# Patient Record
Sex: Female | Born: 1980 | Race: White | Hispanic: No | Marital: Married | Smoking: Former smoker
Health system: Southern US, Community
[De-identification: ages and names within clinical notes are randomized; demographics above are authoritative.]

## PROBLEM LIST (undated history)

## (undated) DIAGNOSIS — E8940 Asymptomatic postprocedural ovarian failure: Secondary | ICD-10-CM

## (undated) DIAGNOSIS — E669 Obesity, unspecified: Secondary | ICD-10-CM

## (undated) DIAGNOSIS — IMO0001 Reserved for inherently not codable concepts without codable children: Secondary | ICD-10-CM

## (undated) HISTORY — PX: NO PAST SURGERIES: SHX2092

## (undated) HISTORY — DX: Asymptomatic postprocedural ovarian failure: E89.40

---

## 2005-07-07 ENCOUNTER — Other Ambulatory Visit: Admission: RE | Admit: 2005-07-07 | Discharge: 2005-07-07 | Payer: Self-pay | Admitting: Gynecology

## 2013-09-17 ENCOUNTER — Emergency Department (HOSPITAL_COMMUNITY): Payer: Managed Care, Other (non HMO)

## 2013-09-17 ENCOUNTER — Emergency Department (HOSPITAL_COMMUNITY)
Admission: EM | Admit: 2013-09-17 | Discharge: 2013-09-18 | Disposition: A | Discharge status: Home / Self Care | Payer: Managed Care, Other (non HMO) | Attending: Emergency Medicine | Admitting: Emergency Medicine

## 2013-09-17 ENCOUNTER — Encounter (HOSPITAL_COMMUNITY): Payer: Self-pay | Admitting: Emergency Medicine

## 2013-09-17 DIAGNOSIS — R110 Nausea: Secondary | ICD-10-CM | POA: Insufficient documentation

## 2013-09-17 DIAGNOSIS — E663 Overweight: Secondary | ICD-10-CM | POA: Insufficient documentation

## 2013-09-17 DIAGNOSIS — Z792 Long term (current) use of antibiotics: Secondary | ICD-10-CM | POA: Insufficient documentation

## 2013-09-17 DIAGNOSIS — Z3202 Encounter for pregnancy test, result negative: Secondary | ICD-10-CM | POA: Insufficient documentation

## 2013-09-17 DIAGNOSIS — N739 Female pelvic inflammatory disease, unspecified: Secondary | ICD-10-CM

## 2013-09-17 HISTORY — DX: Obesity, unspecified: E66.9

## 2013-09-17 LAB — CBC WITH DIFFERENTIAL/PLATELET
BASOS ABS: 0 10*3/uL (ref 0.0–0.1)
Basophils Relative: 0 % (ref 0–1)
EOS PCT: 0 % (ref 0–5)
Eosinophils Absolute: 0 10*3/uL (ref 0.0–0.7)
HCT: 35.7 % — ABNORMAL LOW (ref 36.0–46.0)
Hemoglobin: 11.2 g/dL — ABNORMAL LOW (ref 12.0–15.0)
Lymphocytes Relative: 14 % (ref 12–46)
Lymphs Abs: 1.6 10*3/uL (ref 0.7–4.0)
MCH: 24.4 pg — AB (ref 26.0–34.0)
MCHC: 31.4 g/dL (ref 30.0–36.0)
MCV: 77.8 fL — AB (ref 78.0–100.0)
MONO ABS: 0.5 10*3/uL (ref 0.1–1.0)
Monocytes Relative: 4 % (ref 3–12)
Neutro Abs: 9.6 10*3/uL — ABNORMAL HIGH (ref 1.7–7.7)
Neutrophils Relative %: 82 % — ABNORMAL HIGH (ref 43–77)
PLATELETS: 244 10*3/uL (ref 150–400)
RBC: 4.59 MIL/uL (ref 3.87–5.11)
RDW: 14.8 % (ref 11.5–15.5)
WBC: 11.8 10*3/uL — ABNORMAL HIGH (ref 4.0–10.5)

## 2013-09-17 LAB — WET PREP, GENITAL
Trich, Wet Prep: NONE SEEN
Yeast Wet Prep HPF POC: NONE SEEN

## 2013-09-17 LAB — URINE MICROSCOPIC-ADD ON

## 2013-09-17 LAB — COMPREHENSIVE METABOLIC PANEL
ALT: 28 U/L (ref 0–35)
AST: 24 U/L (ref 0–37)
Albumin: 3.7 g/dL (ref 3.5–5.2)
Alkaline Phosphatase: 92 U/L (ref 39–117)
BUN: 9 mg/dL (ref 6–23)
CALCIUM: 9.1 mg/dL (ref 8.4–10.5)
CO2: 22 mEq/L (ref 19–32)
CREATININE: 0.64 mg/dL (ref 0.50–1.10)
Chloride: 102 mEq/L (ref 96–112)
GLUCOSE: 97 mg/dL (ref 70–99)
Potassium: 4.1 mEq/L (ref 3.7–5.3)
Sodium: 140 mEq/L (ref 137–147)
Total Bilirubin: 0.5 mg/dL (ref 0.3–1.2)
Total Protein: 7.5 g/dL (ref 6.0–8.3)

## 2013-09-17 LAB — URINALYSIS, ROUTINE W REFLEX MICROSCOPIC
Bilirubin Urine: NEGATIVE
Glucose, UA: NEGATIVE mg/dL
KETONES UR: NEGATIVE mg/dL
NITRITE: NEGATIVE
PROTEIN: NEGATIVE mg/dL
Specific Gravity, Urine: 1.017 (ref 1.005–1.030)
UROBILINOGEN UA: 0.2 mg/dL (ref 0.0–1.0)
pH: 5.5 (ref 5.0–8.0)

## 2013-09-17 LAB — POC URINE PREG, ED: PREG TEST UR: NEGATIVE

## 2013-09-17 LAB — LIPASE, BLOOD: Lipase: 22 U/L (ref 11–59)

## 2013-09-17 MED ORDER — IOHEXOL 300 MG/ML  SOLN
100.0000 mL | Freq: Once | INTRAMUSCULAR | Status: AC | PRN
  Administered 2013-09-17: 100 mL via INTRAVENOUS

## 2013-09-17 MED ORDER — STERILE WATER FOR INJECTION IJ SOLN
INTRAMUSCULAR | Status: AC
  Filled 2013-09-17: qty 10

## 2013-09-17 MED ORDER — AZITHROMYCIN 250 MG PO TABS
1000.0000 mg | ORAL_TABLET | Freq: Once | ORAL | Status: AC
  Administered 2013-09-17: 750 mg via ORAL
  Filled 2013-09-17: qty 4

## 2013-09-17 MED ORDER — OXYCODONE-ACETAMINOPHEN 5-325 MG PO TABS: 1.0000 | ORAL_TABLET | Freq: Four times a day (QID) | ORAL | Status: DC | PRN

## 2013-09-17 MED ORDER — CEPHALEXIN 500 MG PO CAPS: 500.0000 mg | ORAL_CAPSULE | Freq: Two times a day (BID) | ORAL | Status: DC

## 2013-09-17 MED ORDER — SODIUM CHLORIDE 0.9 % IV BOLUS (SEPSIS)
1000.0000 mL | Freq: Once | INTRAVENOUS | Status: AC
  Administered 2013-09-17: 1000 mL via INTRAVENOUS

## 2013-09-17 MED ORDER — DOXYCYCLINE HYCLATE 100 MG PO CAPS: 100.0000 mg | ORAL_CAPSULE | Freq: Two times a day (BID) | ORAL | Status: DC

## 2013-09-17 MED ORDER — MORPHINE SULFATE 4 MG/ML IJ SOLN
4.0000 mg | Freq: Once | INTRAMUSCULAR | Status: AC
  Administered 2013-09-17: 4 mg via INTRAVENOUS
  Filled 2013-09-17: qty 1

## 2013-09-17 MED ORDER — AZITHROMYCIN 250 MG PO TABS
250.0000 mg | ORAL_TABLET | Freq: Once | ORAL | Status: AC
  Administered 2013-09-17: 250 mg via ORAL
  Filled 2013-09-17: qty 1

## 2013-09-17 MED ORDER — ONDANSETRON HCL 4 MG/2ML IJ SOLN
4.0000 mg | Freq: Once | INTRAMUSCULAR | Status: AC
  Administered 2013-09-17: 4 mg via INTRAVENOUS
  Filled 2013-09-17: qty 2

## 2013-09-17 MED ORDER — IOHEXOL 300 MG/ML  SOLN
25.0000 mL | Freq: Once | INTRAMUSCULAR | Status: AC | PRN
  Administered 2013-09-17: 25 mL via ORAL

## 2013-09-17 MED ORDER — CEFTRIAXONE SODIUM 250 MG IJ SOLR
250.0000 mg | Freq: Once | INTRAMUSCULAR | Status: AC
  Administered 2013-09-17: 250 mg via INTRAMUSCULAR
  Filled 2013-09-17: qty 250

## 2013-09-17 NOTE — ED Notes (Signed)
Pt reports generalized abd pain for several days, more severe RLQ. Went to an ucc and told to come here. Having nausea, no vomiting or diarrhea.

## 2013-09-17 NOTE — Discharge Instructions (Signed)
Pelvic Inflammatory Disease Pelvic inflammatory disease (PID) refers to an infection in some or all of the female organs. The infection can be in the uterus, ovaries, fallopian tubes, or the surrounding tissues in the pelvis. PID can cause abdominal or pelvic pain that comes on suddenly (acute pelvic pain). PID is a serious infection because it can lead to lasting (chronic) pelvic pain or the inability to have children (infertile).  CAUSES  The infection is often caused by the normal bacteria found in the vaginal tissues. PID may also be caused by an infection that is spread during sexual contact. PID can also occur following:   The birth of a baby.   A miscarriage.   An abortion.   Major pelvic surgery.   The use of an intrauterine device (IUD).   A sexual assault.  RISK FACTORS Certain factors can put a person at higher risk for PID, such as:  Being younger than 25 years.  Being sexually active at [place] age.  Usingnonbarrier contraception.  Havingmultiple sexual partners.  Having sex with someone who has symptoms of a genital infection.  Using oral contraception. Other times, certain behaviors can increase the possibility of getting PID, such as:  Having sex during your period.  Using a vaginal douche.  Having an intrauterine device (IUD) in place. SYMPTOMS   Abdominal or pelvic pain.   Fever.   Chills.   Abnormal vaginal discharge.  Abnormal uterine bleeding.   Unusual pain shortly after finishing your period. DIAGNOSIS  Your caregiver will choose some of the following methods to make a diagnosis, such as:   Performinga physical exam and history. A pelvic exam typically reveals a very tender uterus and surrounding pelvis.   Ordering laboratory tests including a pregnancy test, blood tests, and urine test.  Orderingcultures of the vagina and cervix to check for a sexually transmitted infection (STI).  Performing an ultrasound.    Performing a laparoscopic procedure to look inside the pelvis.  TREATMENT   Antibiotic medicines may be prescribed and taken by mouth.   Sexual partners may be treated when the infection is caused by a sexually transmitted disease (STD).   Hospitalization may be needed to give antibiotics intravenously.  Surgery may be needed, but this is rare. It may take weeks until you are completely well. If you are diagnosed with PID, you should also be checked for human immunodeficiency virus (HIV). HOME CARE INSTRUCTIONS   If given, take your antibiotics as directed. Finish the medicine even if you start to feel better.   Only take over-the-counter or prescription medicines for pain, discomfort, or fever as directed by your caregiver.   Do not have sexual intercourse until treatment is completed or as directed by your caregiver. If PID is confirmed, your recent sexual partner(s) will need treatment.   Keep your follow-up appointments. SEEK MEDICAL CARE IF:   You have increased or abnormal vaginal discharge.   You need prescription medicine for your pain.   You vomit.   You cannot take your medicines.   Your partner has an STD.  SEEK IMMEDIATE MEDICAL CARE IF:   You have a fever.   You have increased abdominal or pelvic pain.   You have chills.   You have pain when you urinate.   You are not better after 72 hours following treatment.  MAKE SURE YOU:   Understand these instructions.  Will watch your condition.  Will get help right away if you are not doing well or get worse.  Document Released: 03/09/2005 Document Revised: 07/04/2012 Document Reviewed: 03/05/2011 [facility] Patient Information 2015 [place], [place]. This information is not intended to replace advice given to you by your health care provider. Make sure you discuss any questions you have with your health care provider.

## 2013-09-17 NOTE — ED Provider Notes (Signed)
CSN: [id number]     Arrival date & time 09/17/13  1405 History   First MD Initiated Contact with Patient 09/17/13 1638     Chief Complaint  Patient presents with  . Abdominal Pain     (Consider location/radiation/quality/duration/timing/severity/associated sxs/prior Treatment) HPI  This is a 49 are old female with no significant past medical history who presents with abdominal pain. Patient reports 2 days of abdominal pain. She reports she had onset of crampy upper abdominal pain 2 days ago. It was not associated with food. It did seem to go away for some time. She had recurrence of pain last night. It seems to have migrated somewhat lower. She endorses nausea without vomiting. She denies any diarrhea or fevers. She denies any urinary symptoms. Patient reports the pain as crampy and nonradiating. She took Advil without relief. Currently her pain is rated at 5/10. Patient was seen in urgent care and referred here.  Past Medical History  Diagnosis Date  . Obesity    History reviewed. No pertinent past surgical history. History reviewed. No pertinent family history. History  Substance Use Topics  . Smoking status: Not on file  . Smokeless tobacco: Not on file  . Alcohol Use: No   OB History   Grav Para Term Preterm Abortions TAB SAB Ect Mult Living                 Review of Systems  Constitutional: Negative for fever.  Respiratory: Negative for cough, chest tightness and shortness of breath.   Cardiovascular: Negative for chest pain.  Gastrointestinal: Positive for nausea and abdominal pain. Negative for vomiting and diarrhea.  Genitourinary: Negative for dysuria.  Musculoskeletal: Negative for back pain.  Neurological: Negative for headaches.  Psychiatric/Behavioral: Negative for confusion.  All other systems reviewed and are negative.     Allergies  Review of patient's allergies indicates no known allergies.  Home Medications   Prior to Admission medications    Medication Sig Start Date End Date Taking? Authorizing Provider  ibuprofen (ADVIL,MOTRIN) 200 MG tablet Take 800 mg by mouth every 6 (six) hours as needed.   Yes Historical Provider, MD  cephALEXin (KEFLEX) 500 MG capsule Take 1 capsule (500 mg total) by mouth 2 (two) times daily. 09/17/13   Merryl Hacker, MD  doxycycline (VIBRAMYCIN) 100 MG capsule Take 1 capsule (100 mg total) by mouth 2 (two) times daily. 09/17/13   Merryl Hacker, MD  oxyCODONE-acetaminophen (PERCOCET/ROXICET) 5-325 MG per tablet Take 1 tablet by mouth every 6 (six) hours as needed for moderate pain or severe pain. 09/17/13   Merryl Hacker, MD   BP 125/73  Pulse 92  Temp(Src) 98 F (36.7 C)  Resp 22  Ht 5\' 4"  (1.626 m)  Wt 227 lb 11.2 oz (103.284 kg)  BMI 39.07 kg/m2  SpO2 99%  LMP 08/27/2013 Physical Exam  Nursing note and vitals reviewed. Constitutional: She is oriented to person, place, and time. She appears well-developed and well-nourished. No distress.  Overweight  HENT:  Head: Normocephalic and atraumatic.  Eyes: Pupils are equal, round, and reactive to light.  Neck: Neck supple.  Cardiovascular: Normal rate, regular rhythm and normal heart sounds.   No murmur heard. Pulmonary/Chest: Effort normal and breath sounds normal. No respiratory distress. She has no wheezes.  Abdominal: Soft. Bowel sounds are normal. She exhibits no distension. There is tenderness. There is no rebound and no guarding.  Diffuse tenderness to palpation, most significant in the right lower quadrant, negative Rovsing  Genitourinary: Vagina normal.  Scant vaginal discharge, no cervical motion tenderness or cervical friability, enlarged ovaries bilaterally with tenderness to palpation over the right adnexa  Neurological: She is alert and oriented to person, place, and time.  Skin: Skin is warm and dry.  Psychiatric: She has a normal mood and affect.    ED Course  Procedures (including critical care time) Labs Review Labs  Reviewed  WET PREP, GENITAL - Abnormal; Notable for the following:    Clue Cells Wet Prep HPF POC FEW (*)    WBC, Wet Prep HPF POC MODERATE (*)    All other components within normal limits  CBC WITH DIFFERENTIAL - Abnormal; Notable for the following:    WBC 11.8 (*)    Hemoglobin 11.2 (*)    HCT 35.7 (*)    MCV 77.8 (*)    MCH 24.4 (*)    Neutrophils Relative % 82 (*)    Neutro Abs 9.6 (*)    All other components within normal limits  URINALYSIS, ROUTINE W REFLEX MICROSCOPIC - Abnormal; Notable for the following:    APPearance CLOUDY (*)    Hgb urine dipstick MODERATE (*)    Leukocytes, UA LARGE (*)    All other components within normal limits  URINE MICROSCOPIC-ADD ON - Abnormal; Notable for the following:    Squamous Epithelial / LPF MANY (*)    Bacteria, UA MANY (*)    All other components within normal limits  URINE CULTURE  GC/CHLAMYDIA PROBE AMP  COMPREHENSIVE METABOLIC PANEL  LIPASE, BLOOD  RPR  HIV ANTIBODY (ROUTINE TESTING)  POC URINE PREG, ED    Imaging Review US Transvaginal Non-ob  09/17/2013   CLINICAL DATA:  Abdominal pain.  Evaluate ovaries.  EXAM: TRANSABDOMINAL AND TRANSVAGINAL ULTRASOUND OF PELVIS  TECHNIQUE: Both transabdominal and transvaginal ultrasound examinations of the pelvis were performed. Transabdominal technique was performed for global imaging of the pelvis including uterus, ovaries, adnexal regions, and pelvic cul-de-sac. It was necessary to proceed with endovaginal exam following the transabdominal exam to visualize the adnexae.  COMPARISON:  Abdominal CT from the same day  FINDINGS: Uterus  Measurements: 10 x 6 x 6 cm. There is a 3 cm subserosal heterogeneous, partially shadowing mass in the region of the right uterine body, most consistent with fibroid.  Endometrium  Thickness: 10 mm. There bright spectral reflectors within the endometrial cavity, without shadowing. No endometrial gas on preceding CT.  Right ovary  Measurements: 2 cystic structures  within the right adnexa, the larger measuring 3.3 cm in diameter, which likely have thick walls. The larger cyst appears to have layering debris. Neither have definitive septation. It is difficult to distinguish the ovary. Best estimate for ovarian size is 5.6 x 3.3 x 5 cm. No hydrosalpinx or pyosalpinx identified.  Left ovary  There is a simple appearing intra-ovarian cyst which measures 4.2 x 2.8 x 3.5 cm. The cyst causes overall enlargement of the ovary to 5.3 x 3.5 x 3.5 cm. No complex fluid collection seen within the left adnexa. Hydrosalpinx or pyosalpinx not identified, although it may have been present on the prior CT  Other findings  Trace, simple appearing free pelvic fluid.  IMPRESSION: 1. Suspect pelvic inflammatory disease by CT. There are 2 mildly complex right adnexal cysts, up to 3 cm diameter, which could reflect tuboovarian abscess. 2. 4 cm left ovarian cyst which appears simple, favoring follicular cyst rather than abscess.   Electronically Signed   By: Jorje Guild M.D.   On:  09/17/2013 23:16   US Pelvis Complete  09/17/2013   CLINICAL DATA:  Abdominal pain.  Evaluate ovaries.  EXAM: TRANSABDOMINAL AND TRANSVAGINAL ULTRASOUND OF PELVIS  TECHNIQUE: Both transabdominal and transvaginal ultrasound examinations of the pelvis were performed. Transabdominal technique was performed for global imaging of the pelvis including uterus, ovaries, adnexal regions, and pelvic cul-de-sac. It was necessary to proceed with endovaginal exam following the transabdominal exam to visualize the adnexae.  COMPARISON:  Abdominal CT from the same day  FINDINGS: Uterus  Measurements: 10 x 6 x 6 cm. There is a 3 cm subserosal heterogeneous, partially shadowing mass in the region of the right uterine body, most consistent with fibroid.  Endometrium  Thickness: 10 mm. There bright spectral reflectors within the endometrial cavity, without shadowing. No endometrial gas on preceding CT.  Right ovary  Measurements: 2  cystic structures within the right adnexa, the larger measuring 3.3 cm in diameter, which likely have thick walls. The larger cyst appears to have layering debris. Neither have definitive septation. It is difficult to distinguish the ovary. Best estimate for ovarian size is 5.6 x 3.3 x 5 cm. No hydrosalpinx or pyosalpinx identified.  Left ovary  There is a simple appearing intra-ovarian cyst which measures 4.2 x 2.8 x 3.5 cm. The cyst causes overall enlargement of the ovary to 5.3 x 3.5 x 3.5 cm. No complex fluid collection seen within the left adnexa. Hydrosalpinx or pyosalpinx not identified, although it may have been present on the prior CT  Other findings  Trace, simple appearing free pelvic fluid.  IMPRESSION: 1. Suspect pelvic inflammatory disease by CT. There are 2 mildly complex right adnexal cysts, up to 3 cm diameter, which could reflect tuboovarian abscess. 2. 4 cm left ovarian cyst which appears simple, favoring follicular cyst rather than abscess.   Electronically Signed   By: Jorje Guild M.D.   On: 09/17/2013 23:16   Ct Abdomen Pelvis W Contrast  09/17/2013   CLINICAL DATA:  Right lower quadrant pain for several days  EXAM: CT ABDOMEN AND PELVIS WITH CONTRAST  TECHNIQUE: Multidetector CT imaging of the abdomen and pelvis was performed using the standard protocol following bolus administration of intravenous contrast.  CONTRAST:  132mL OMNIPAQUE IOHEXOL 300 MG/ML  SOLN  COMPARISON:  None.  FINDINGS: The lung bases are clear. There is no pleural or pericardial effusion. The normal appearance of the liver. The gallbladder appears normal. The pancreas is unremarkable. Normal appearance of the spleen.  The adrenal glands are both unremarkable. The kidneys both appear normal. The urinary bladder appears normal. Trauma stress set there is a fibroid within the right lateral myometrium measuring 3 cm, image 71/series 2. Bilateral cystic structures are identified within the adnexal region. On the right  there is a complex cystic mass which measures 5.1 x 5.2 by 4.5 cm. There is surrounding fat stranding and a small amount of fluid identified. On the left this measures 5.3 x 4.0 x 5.2 cm. Bilateral, fluid attenuating tubular structures are associated with the adnexal masses, which are suspicious for hydrosalpinx.  Normal caliber of the abdominal aorta. No aneurysm. No pathologically enlarged upper abdominal or pelvic lymph nodes. Left external iliac node is prominent measuring 8 mm. No inguinal adenopathy.  The stomach appears normal. The small bowel loops have a normal course and caliber. No obstruction. The appendix is difficult to visualize separate from the right lower quadrant bowel loops. Normal appearance of the colon.  Review of the visualized bony structures is negative for  aggressive lytic or sclerotic bone lesion.  IMPRESSION: 1. Bilateral cystic masses are identified within the adnexal regions. If there are signs and symptoms of infection tubo-ovarian abscess cannot be excluded. Recommend further evaluation with pelvic sonogram. 2. The appendix is difficult to visualize separate from the right lower quadrant bowel wall loops.   Electronically Signed   By: Kerby Moors M.D.   On: 09/17/2013 20:44     EKG Interpretation None      MDM   Final diagnoses:  PID (pelvic inflammatory disease)    Patient presents with abdominal pain. She is nontoxic on exam. She's afebrile. On exam, tenderness appears to be most localized to the right lower quadrant but is also diffuse.  Initial workup notable for leukocytosis of 11.8.  Patient also noted to have 11-20 white cells and many bacteria in her urine. Given the right lower quadrant tenderness to palpation, CT scan obtained to rule out appendicitis. CT scan with bilateral cystic masses in the adnexal regions. Recommend CT scan. Appendix was not fully visualized.  Patient reports that she was in a monogamous relationship with her husband. She reports that  she does not have any concerns for STDs or any recent vaginal discharge.  Ultrasounds concerning for pelvic inflammatory disease. Patient was screened for STDs. We'll treat empirically with azithromycin, Rocephin, and patient will be discharged on doxycycline. The patient is appropriate for outpatient treatment. We'll also add Keflex for possible UTI. Patient is new to the area at this time her primary provider. Discuss with patient the results and concern for infection at this time. This is likely the source of the patient's pain. Patient will be referred to OB/GYN. Patient was given strict return precautions.  After history, exam, and medical workup I feel the patient has been appropriately medically screened and is safe for discharge home. Pertinent diagnoses were discussed with the patient. Patient was given return precautions.     Merryl Hacker, MD 09/18/13 0000

## 2013-09-18 LAB — GC/CHLAMYDIA PROBE AMP
CT PROBE, AMP APTIMA: NEGATIVE
GC PROBE AMP APTIMA: NEGATIVE

## 2013-09-18 LAB — RPR

## 2013-09-18 LAB — HIV ANTIBODY (ROUTINE TESTING W REFLEX): HIV: NONREACTIVE

## 2013-09-18 MED ORDER — OXYCODONE-ACETAMINOPHEN 5-325 MG PO TABS
1.0000 | ORAL_TABLET | Freq: Once | ORAL | Status: AC
  Administered 2013-09-18: 1 via ORAL
  Filled 2013-09-18: qty 1

## 2013-09-19 LAB — URINE CULTURE: Colony Count: >=100000

## 2013-09-22 ENCOUNTER — Inpatient Hospital Stay (HOSPITAL_COMMUNITY)
Admission: EM | Admit: 2013-09-22 | Discharge: 2013-09-23 | DRG: 759 | Disposition: A | Discharge status: Home / Self Care | Payer: Managed Care, Other (non HMO) | Attending: Obstetrics and Gynecology | Admitting: Obstetrics and Gynecology

## 2013-09-22 ENCOUNTER — Inpatient Hospital Stay (HOSPITAL_COMMUNITY): Payer: Managed Care, Other (non HMO)

## 2013-09-22 ENCOUNTER — Encounter (HOSPITAL_COMMUNITY): Payer: Self-pay | Admitting: Emergency Medicine

## 2013-09-22 DIAGNOSIS — N730 Acute parametritis and pelvic cellulitis: Secondary | ICD-10-CM

## 2013-09-22 DIAGNOSIS — N7093 Salpingitis and oophoritis, unspecified: Secondary | ICD-10-CM

## 2013-09-22 DIAGNOSIS — N7003 Acute salpingitis and oophoritis: Principal | ICD-10-CM | POA: Diagnosis present

## 2013-09-22 DIAGNOSIS — E669 Obesity, unspecified: Secondary | ICD-10-CM | POA: Diagnosis present

## 2013-09-22 LAB — COMPREHENSIVE METABOLIC PANEL
ALT: 16 U/L (ref 0–35)
AST: 17 U/L (ref 0–37)
Albumin: 3.6 g/dL (ref 3.5–5.2)
Alkaline Phosphatase: 82 U/L (ref 39–117)
Anion gap: 13 (ref 5–15)
BUN: 8 mg/dL (ref 6–23)
CHLORIDE: 101 meq/L (ref 96–112)
CO2: 25 meq/L (ref 19–32)
CREATININE: 0.65 mg/dL (ref 0.50–1.10)
Calcium: 9.5 mg/dL (ref 8.4–10.5)
GFR calc Af Amer: >90 mL/min (ref >90)
GFR calc non Af Amer: >90 mL/min (ref >90)
Glucose, Bld: 94 mg/dL (ref 70–99)
Potassium: 3.8 mEq/L (ref 3.7–5.3)
Sodium: 139 mEq/L (ref 137–147)
Total Protein: 7.9 g/dL (ref 6.0–8.3)

## 2013-09-22 LAB — URINALYSIS, ROUTINE W REFLEX MICROSCOPIC
BILIRUBIN URINE: NEGATIVE
GLUCOSE, UA: NEGATIVE mg/dL
HGB URINE DIPSTICK: NEGATIVE
Ketones, ur: NEGATIVE mg/dL
Leukocytes, UA: NEGATIVE
Nitrite: NEGATIVE
Protein, ur: NEGATIVE mg/dL
Specific Gravity, Urine: 1.025 (ref 1.005–1.030)
Urobilinogen, UA: 0.2 mg/dL (ref 0.0–1.0)
pH: 7.5 (ref 5.0–8.0)

## 2013-09-22 LAB — CBC WITH DIFFERENTIAL/PLATELET
BASOS ABS: 0 10*3/uL (ref 0.0–0.1)
Basophils Relative: 0 % (ref 0–1)
EOS PCT: 2 % (ref 0–5)
Eosinophils Absolute: 0.1 10*3/uL (ref 0.0–0.7)
HEMATOCRIT: 32.9 % — AB (ref 36.0–46.0)
HEMOGLOBIN: 10.2 g/dL — AB (ref 12.0–15.0)
LYMPHS ABS: 1.9 10*3/uL (ref 0.7–4.0)
LYMPHS PCT: 24 % (ref 12–46)
MCH: 24.2 pg — ABNORMAL LOW (ref 26.0–34.0)
MCHC: 31 g/dL (ref 30.0–36.0)
MCV: 78 fL (ref 78.0–100.0)
MONO ABS: 0.4 10*3/uL (ref 0.1–1.0)
Monocytes Relative: 4 % (ref 3–12)
Neutro Abs: 5.6 10*3/uL (ref 1.7–7.7)
Neutrophils Relative %: 70 % (ref 43–77)
Platelets: 287 10*3/uL (ref 150–400)
RBC: 4.22 MIL/uL (ref 3.87–5.11)
RDW: 14.5 % (ref 11.5–15.5)
WBC: 8 10*3/uL (ref 4.0–10.5)

## 2013-09-22 LAB — POC OCCULT BLOOD, ED: FECAL OCCULT BLD: NEGATIVE

## 2013-09-22 LAB — POC URINE PREG, ED: Preg Test, Ur: NEGATIVE

## 2013-09-22 MED ORDER — ONDANSETRON HCL 4 MG PO TABS: 4.0000 mg | ORAL_TABLET | Freq: Four times a day (QID) | ORAL | Status: DC | PRN

## 2013-09-22 MED ORDER — DOCUSATE SODIUM 100 MG PO CAPS
100.0000 mg | ORAL_CAPSULE | Freq: Two times a day (BID) | ORAL | Status: DC
  Administered 2013-09-22: 100 mg via ORAL
  Filled 2013-09-22: qty 1

## 2013-09-22 MED ORDER — DOXYCYCLINE HYCLATE 100 MG PO TABS
100.0000 mg | ORAL_TABLET | Freq: Two times a day (BID) | ORAL | Status: DC
  Administered 2013-09-22 – 2013-09-23 (×2): 100 mg via ORAL
  Filled 2013-09-22 (×2): qty 1

## 2013-09-22 MED ORDER — ONDANSETRON HCL 4 MG/2ML IJ SOLN: 4.0000 mg | Freq: Four times a day (QID) | INTRAMUSCULAR | Status: DC | PRN

## 2013-09-22 MED ORDER — DEXTROSE 5 % IV SOLN
2.0000 g | Freq: Four times a day (QID) | INTRAVENOUS | Status: DC
  Administered 2013-09-22 – 2013-09-23 (×3): 2 g via INTRAVENOUS
  Filled 2013-09-22 (×4): qty 2

## 2013-09-22 MED ORDER — OXYCODONE-ACETAMINOPHEN 5-325 MG PO TABS
1.0000 | ORAL_TABLET | ORAL | Status: DC | PRN
  Administered 2013-09-22 – 2013-09-23 (×3): 2 via ORAL
  Administered 2013-09-23: 1 via ORAL
  Filled 2013-09-22: qty 1
  Filled 2013-09-22 (×3): qty 2

## 2013-09-22 MED ORDER — LACTATED RINGERS IV SOLN
INTRAVENOUS | Status: DC
  Administered 2013-09-22: 18:00:00 via INTRAVENOUS

## 2013-09-22 MED ORDER — MORPHINE SULFATE 2 MG/ML IJ SOLN
2.0000 mg | INTRAMUSCULAR | Status: DC | PRN
  Administered 2013-09-22: 2 mg via INTRAVENOUS
  Filled 2013-09-22: qty 1

## 2013-09-22 MED ORDER — FENTANYL CITRATE 0.05 MG/ML IJ SOLN
100.0000 ug | Freq: Once | INTRAMUSCULAR | Status: AC
  Administered 2013-09-22: 100 ug via INTRAVENOUS
  Filled 2013-09-22: qty 2

## 2013-09-22 MED ORDER — PRENATAL MULTIVITAMIN CH: 1.0000 | ORAL_TABLET | Freq: Every day | ORAL | Status: DC

## 2013-09-22 MED ORDER — IBUPROFEN 600 MG PO TABS: 600.0000 mg | ORAL_TABLET | Freq: Four times a day (QID) | ORAL | Status: DC | PRN

## 2013-09-22 NOTE — H&P (Addendum)
Penny Stanley is an 33 y.o. female transferred from [facility] ED for inpatient management of TOA. Patient was diagnosed with a right TOA/PID on 6/28 and was treated with antibiotics. Patient returns to ED with worsening abdominal pain. Patient denies nausea, emesis, fevers or chills.   Menstrual History: Patient's last menstrual period was 09/17/2013.    Past Medical History  Diagnosis Date  . Obesity     History reviewed. No pertinent past surgical history.  No family history on file.  Social History:  reports that she has never smoked. She does not have any smokeless tobacco history on file. She reports that she does not drink alcohol or use illicit drugs.  Allergies: No Known Allergies  Prescriptions prior to admission  Medication Sig Dispense Refill  . cephALEXin (KEFLEX) 500 MG capsule Take 1 capsule (500 mg total) by mouth 2 (two) times daily.  6 capsule  0  . doxycycline (VIBRAMYCIN) 100 MG capsule Take 1 capsule (100 mg total) by mouth 2 (two) times daily.  20 capsule  0  . ibuprofen (ADVIL,MOTRIN) 200 MG tablet Take 800 mg by mouth every 6 (six) hours as needed.      Marland Kitchen oxyCODONE-acetaminophen (PERCOCET/ROXICET) 5-325 MG per tablet Take 1 tablet by mouth every 6 (six) hours as needed for moderate pain or severe pain.  15 tablet  0    Review of Systems  All other systems reviewed and are negative.   Blood pressure 125/70, pulse 82, temperature 97.9 F (36.6 C), temperature source Oral, resp. rate 20, weight 223 lb (101.152 kg), last menstrual period 09/17/2013, SpO2 98.00%. Physical Exam GENERAL: Well-developed, well-nourished female in no acute distress.  LUNGS: Clear to auscultation bilaterally.  HEART: Regular rate and rhythm. ABDOMEN: Soft, mild tenderness in lower quadrants, no rebound, no guarding EXTREMITIES: No cyanosis, clubbing, or edema, 2+ distal pulses.  Results for orders placed during the hospital encounter of 09/22/13 (from the past 24 hour(s))  CBC WITH  DIFFERENTIAL     Status: Abnormal   Collection Time    09/22/13  8:54 AM      Result Value Ref Range   WBC 8.0  4.0 - 10.5 K/uL   RBC 4.22  3.87 - 5.11 MIL/uL   Hemoglobin 10.2 (*) 12.0 - 15.0 g/dL   HCT 32.9 (*) 36.0 - 46.0 %   MCV 78.0  78.0 - 100.0 fL   MCH 24.2 (*) 26.0 - 34.0 pg   MCHC 31.0  30.0 - 36.0 g/dL   RDW 14.5  11.5 - 15.5 %   Platelets 287  150 - 400 K/uL   Neutrophils Relative % 70  43 - 77 %   Neutro Abs 5.6  1.7 - 7.7 K/uL   Lymphocytes Relative 24  12 - 46 %   Lymphs Abs 1.9  0.7 - 4.0 K/uL   Monocytes Relative 4  3 - 12 %   Monocytes Absolute 0.4  0.1 - 1.0 K/uL   Eosinophils Relative 2  0 - 5 %   Eosinophils Absolute 0.1  0.0 - 0.7 K/uL   Basophils Relative 0  0 - 1 %   Basophils Absolute 0.0  0.0 - 0.1 K/uL  COMPREHENSIVE METABOLIC PANEL     Status: Abnormal   Collection Time    09/22/13  8:54 AM      Result Value Ref Range   Sodium 139  137 - 147 mEq/L   Potassium 3.8  3.7 - 5.3 mEq/L   Chloride 101  96 -  112 mEq/L   CO2 25  19 - 32 mEq/L   Glucose, Bld 94  70 - 99 mg/dL   BUN 8  6 - 23 mg/dL   Creatinine, Ser 0.65  0.50 - 1.10 mg/dL   Calcium 9.5  8.4 - 10.5 mg/dL   Total Protein 7.9  6.0 - 8.3 g/dL   Albumin 3.6  3.5 - 5.2 g/dL   AST 17  0 - 37 U/L   ALT 16  0 - 35 U/L   Alkaline Phosphatase 82  39 - 117 U/L   Total Bilirubin <0.2 (*) 0.3 - 1.2 mg/dL   GFR calc non Af Amer >90  >90 mL/min   GFR calc Af Amer >90  >90 mL/min   Anion gap 13  5 - 15  URINALYSIS, ROUTINE W REFLEX MICROSCOPIC     Status: Abnormal   Collection Time    09/22/13  9:23 AM      Result Value Ref Range   Color, Urine YELLOW  YELLOW   APPearance HAZY (*) CLEAR   Specific Gravity, Urine 1.025  1.005 - 1.030   pH 7.5  5.0 - 8.0   Glucose, UA NEGATIVE  NEGATIVE mg/dL   Hgb urine dipstick NEGATIVE  NEGATIVE   Bilirubin Urine NEGATIVE  NEGATIVE   Ketones, ur NEGATIVE  NEGATIVE mg/dL   Protein, ur NEGATIVE  NEGATIVE mg/dL   Urobilinogen, UA 0.2  0.0 - 1.0 mg/dL   Nitrite  NEGATIVE  NEGATIVE   Leukocytes, UA NEGATIVE  NEGATIVE  POC URINE PREG, ED     Status: None   Collection Time    09/22/13  9:31 AM      Result Value Ref Range   Preg Test, Ur NEGATIVE  NEGATIVE  POC OCCULT BLOOD, ED     Status: None   Collection Time    09/22/13 11:55 AM      Result Value Ref Range   Fecal Occult Bld NEGATIVE  NEGATIVE    No results found.  Assessment/Plan: 33 yo admitted for inpatient management of TOA - IV antibiotics - pain management prn - Will repeat pelvic ultrasound   Daryus Sowash 09/22/2013, 5:28 PM

## 2013-09-22 NOTE — ED Notes (Signed)
Pt requesting pain medicine, MD made aware

## 2013-09-22 NOTE — ED Provider Notes (Signed)
CSN: [id number]     Arrival date & time 09/22/13  0830 History   First MD Initiated Contact with Patient 09/22/13 [phone]     Chief Complaint  Patient presents with  . Abdominal Pain     (Consider location/radiation/quality/duration/timing/severity/associated sxs/prior Treatment) HPI Patient presents with abdominal pain. Patient was recently here in the ED for similar symptoms. She was eventually discharged home with treatment for PID and UTI on doxycycline and keflex. She explains that her pain is more epigastric rather than pelvic and thinks her pelvic pain was more from her menstrual cycle. Epigastric pain is sharp and does not radiate. Not made worse by anything and was taking oxycodone which provided little relief. No chest pain, shortness of breath. Does have some nausea and vomiting. No hematemesis. No diarrhea or constipation. No bloody stool but has had bloody stool about one month ago. States she has taken 10 pills of Advil (most likely 200mg ) for shoulder pain daily for months. Her menstrual cycle recently ended. She is on Day # 6 of doxycycline and has finished her Keflex. No vaginal discharge or bleeding (other than from recent menstruation)  Past Medical History  Diagnosis Date  . Obesity    Past Surgical History  Procedure Laterality Date  . No past surgeries     History reviewed. No pertinent family history. History  Substance Use Topics  . Smoking status: Never Smoker   . Smokeless tobacco: Not on file  . Alcohol Use: No   OB History   Grav Para Term Preterm Abortions TAB SAB Ect Mult Living   1 1 1       1      Review of Systems  Respiratory: Negative for shortness of breath.   Cardiovascular: Negative for chest pain and palpitations.  Gastrointestinal: Positive for nausea, vomiting, abdominal pain and blood in stool (a month ago. none recently). Negative for diarrhea, constipation and abdominal distention.  Genitourinary: Positive for pelvic pain. Negative for  dysuria, urgency, frequency, hematuria, vaginal bleeding, vaginal discharge and vaginal pain.  Neurological: Negative for syncope and light-headedness.  All other systems reviewed and are negative.     Allergies  Review of patient's allergies indicates no known allergies.  Home Medications   Prior to Admission medications   Medication Sig Start Date End Date Taking? Authorizing Provider  cephALEXin (KEFLEX) 500 MG capsule Take 1 capsule (500 mg total) by mouth 2 (two) times daily. 09/17/13  Yes Merryl Hacker, MD  doxycycline (VIBRAMYCIN) 100 MG capsule Take 1 capsule (100 mg total) by mouth 2 (two) times daily. 09/17/13  Yes Merryl Hacker, MD  ibuprofen (ADVIL,MOTRIN) 200 MG tablet Take 800 mg by mouth every 6 (six) hours as needed.   Yes Historical Provider, MD  oxyCODONE-acetaminophen (PERCOCET/ROXICET) 5-325 MG per tablet Take 1 tablet by mouth every 6 (six) hours as needed for moderate pain or severe pain. 09/17/13  Yes Merryl Hacker, MD   BP 115/79  Pulse 79  Temp(Src) 98.1 F (36.7 C) (Oral)  Resp 18  Wt 223 lb (101.152 kg)  SpO2 95%  LMP 09/17/2013 Physical Exam  Vitals reviewed. Constitutional: She is oriented to person, place, and time. She appears well-developed and well-nourished.  Cardiovascular: Normal rate and regular rhythm.   No murmur heard. Pulmonary/Chest: Effort normal and breath sounds normal. No respiratory distress. She has no wheezes.  Abdominal: Soft. Bowel sounds are normal. She exhibits no distension. There is tenderness in the periumbilical area and suprapubic area.  Musculoskeletal: She  exhibits no edema.  Neurological: She is alert and oriented to person, place, and time.  Skin: Skin is warm and dry.    ED Course  Procedures (including critical care time) Labs Review Labs Reviewed  CBC WITH DIFFERENTIAL - Abnormal; Notable for the following:    Hemoglobin 10.2 (*)    HCT 32.9 (*)    MCH 24.2 (*)    All other components within  normal limits  COMPREHENSIVE METABOLIC PANEL - Abnormal; Notable for the following:    Total Bilirubin <0.2 (*)    All other components within normal limits  URINALYSIS, ROUTINE W REFLEX MICROSCOPIC - Abnormal; Notable for the following:    APPearance HAZY (*)    All other components within normal limits  POC URINE PREG, ED  POC OCCULT BLOOD, ED    Imaging Review No results found.   EKG Interpretation None      MDM   Final diagnoses:  PID (acute pelvic inflammatory disease)   Patient's labs stable. Pain controlled with morphine. With previous imaging results, concern for possible pelvic disease. Gynecology consulted for admission and evaluation. Dr. Elly Modena accepting physician. Patient will be transported by mother. Patient and mother update with plan and agree.   Cordelia Poche, MD 09/22/13 2020

## 2013-09-22 NOTE — ED Notes (Signed)
Pt arrives via POV from home with sharp upper abdominal pain, constant ache since Saturday. States was seen here Sunday and diagnosed with ovarian cyst. Pt states she doesn't feel this is the same pain. Pt denies fever, diarrhea. Pt reports hx of bloody stool a few weeks ago. Denies blood in stool currently. Pt awake, alert, oriented x4, mod distress noted due to pain.

## 2013-09-23 DIAGNOSIS — N7093 Salpingitis and oophoritis, unspecified: Secondary | ICD-10-CM

## 2013-09-23 DIAGNOSIS — N730 Acute parametritis and pelvic cellulitis: Secondary | ICD-10-CM

## 2013-09-23 MED ORDER — DSS 100 MG PO CAPS: 100.0000 mg | ORAL_CAPSULE | Freq: Two times a day (BID) | ORAL | Status: DC

## 2013-09-23 MED ORDER — OXYCODONE-ACETAMINOPHEN 5-325 MG PO TABS: 1.0000 | ORAL_TABLET | ORAL | Status: DC | PRN

## 2013-09-23 MED ORDER — IBUPROFEN 600 MG PO TABS: 600.0000 mg | ORAL_TABLET | Freq: Four times a day (QID) | ORAL | Status: DC | PRN

## 2013-09-23 MED ORDER — METRONIDAZOLE 500 MG PO TABS: 500.0000 mg | ORAL_TABLET | Freq: Two times a day (BID) | ORAL | Status: AC

## 2013-09-23 MED ORDER — DOXYCYCLINE HYCLATE 100 MG PO TABS: 100.0000 mg | ORAL_TABLET | Freq: Two times a day (BID) | ORAL | Status: DC

## 2013-09-23 NOTE — Discharge Summary (Signed)
Physician Discharge Summary  Patient ID: Penny Stanley MRN: [id number] DOB/AGE: 25-Mar-1980 33 y.o.  Admit date: 09/22/2013 Discharge date: 09/23/2013  Admission Diagnoses: Right TOA   Discharge Diagnoses: same Active Problems:   TOA (tubo-ovarian abscess)   Discharged Condition: good  Hospital Course: Patient admitted for IV antibiotics secondary to persistent right TOA. Patient was afebrile with a normal white count but reported worsening abdominal pain. On day of discharge patient reported that her pain was better controlled and requested to be discharged.  Consults: None  Significant Diagnostic Studies: radiology: Ultrasound: 5 cm right TOA  Treatments: IV hydration, antibiotics: mefoxitin/doxycycline and analgesia: percocet/ibuprofen  Discharge Exam: Blood pressure 108/75, pulse 80, temperature 97.7 F (36.5 C), temperature source Oral, resp. rate 18, height 5\' 4"  (1.626 m), weight 223 lb (101.152 kg), last menstrual period 09/17/2013, SpO2 98.00%. General appearance: alert, cooperative and no distress Resp: clear to auscultation bilaterally Cardio: regular rate and rhythm GI: soft, RLQ tenderness, no rebound, no guarding Extremities: Homans sign is negative, no sign of DVT  Disposition: 01-Home or Self Care     Medication List    STOP taking these medications       doxycycline 100 MG capsule  Commonly known as:  VIBRAMYCIN  Replaced by:  doxycycline 100 MG tablet      TAKE these medications       cephALEXin 500 MG capsule  Commonly known as:  KEFLEX  Take 1 capsule (500 mg total) by mouth 2 (two) times daily.     doxycycline 100 MG tablet  Commonly known as:  VIBRA-TABS  Take 1 tablet (100 mg total) by mouth every 12 (twelve) hours.     DSS 100 MG Caps  Take 100 mg by mouth 2 (two) times daily.     ibuprofen 600 MG tablet  Commonly known as:  ADVIL,MOTRIN  Take 1 tablet (600 mg total) by mouth every 6 (six) hours as needed (mild pain).     metroNIDAZOLE 500 MG tablet  Commonly known as:  FLAGYL  Take 1 tablet (500 mg total) by mouth 2 (two) times daily.     oxyCODONE-acetaminophen 5-325 MG per tablet  Commonly known as:  PERCOCET/ROXICET  Take 1 tablet by mouth every 4 (four) hours as needed for moderate pain or severe pain.           Follow-up Information   Follow up with [facility]. (An appointment will be made for you to follow up in 2 weeks)    Specialty:  Obstetrics and Gynecology   Contact information:   [place] [address] [phone]      Signed: Mackenzy Eisenberg 09/23/2013, 7:39 AM

## 2013-09-23 NOTE — Discharge Instructions (Signed)
We are having you admitted to [facility]. Dr. Elly Modena is the admitting physician. Your mom will drive you to your destination. Please drive to the MAU and explain that you are to be admitted to the third floor. Pelvic Inflammatory Disease Pelvic inflammatory disease (PID) refers to an infection in some or all of the female organs. The infection can be in the uterus, ovaries, fallopian tubes, or the surrounding tissues in the pelvis. PID can cause abdominal or pelvic pain that comes on suddenly (acute pelvic pain). PID is a serious infection because it can lead to lasting (chronic) pelvic pain or the inability to have children (infertile).  CAUSES  The infection is often caused by the normal bacteria found in the vaginal tissues. PID may also be caused by an infection that is spread during sexual contact. PID can also occur following:   The birth of a baby.   A miscarriage.   An abortion.   Major pelvic surgery.   The use of an intrauterine device (IUD).   A sexual assault.  RISK FACTORS Certain factors can put a person at higher risk for PID, such as:  Being younger than 25 years.  Being sexually active at [place] age.  Usingnonbarrier contraception.  Havingmultiple sexual partners.  Having sex with someone who has symptoms of a genital infection.  Using oral contraception. Other times, certain behaviors can increase the possibility of getting PID, such as:  Having sex during your period.  Using a vaginal douche.  Having an intrauterine device (IUD) in place. SYMPTOMS   Abdominal or pelvic pain.   Fever.   Chills.   Abnormal vaginal discharge.  Abnormal uterine bleeding.   Unusual pain shortly after finishing your period. DIAGNOSIS  Your caregiver will choose some of the following methods to make a diagnosis, such as:   Performinga physical exam and history. A pelvic exam typically reveals a very tender uterus and surrounding pelvis.    Ordering laboratory tests including a pregnancy test, blood tests, and urine test.  Orderingcultures of the vagina and cervix to check for a sexually transmitted infection (STI).  Performing an ultrasound.   Performing a laparoscopic procedure to look inside the pelvis.  TREATMENT   Antibiotic medicines may be prescribed and taken by mouth.   Sexual partners may be treated when the infection is caused by a sexually transmitted disease (STD).   Hospitalization may be needed to give antibiotics intravenously.  Surgery may be needed, but this is rare. It may take weeks until you are completely well. If you are diagnosed with PID, you should also be checked for human immunodeficiency virus (HIV). HOME CARE INSTRUCTIONS   If given, take your antibiotics as directed. Finish the medicine even if you start to feel better.   Only take over-the-counter or prescription medicines for pain, discomfort, or fever as directed by your caregiver.   Do not have sexual intercourse until treatment is completed or as directed by your caregiver. If PID is confirmed, your recent sexual partner(s) will need treatment.   Keep your follow-up appointments. SEEK MEDICAL CARE IF:   You have increased or abnormal vaginal discharge.   You need prescription medicine for your pain.   You vomit.   You cannot take your medicines.   Your partner has an STD.  SEEK IMMEDIATE MEDICAL CARE IF:   You have a fever.   You have increased abdominal or pelvic pain.   You have chills.   You have pain when you urinate.  You are not better after 72 hours following treatment.  MAKE SURE YOU:   Understand these instructions.  Will watch your condition.  Will get help right away if you are not doing well or get worse. Document Released: 03/09/2005 Document Revised: 07/04/2012 Document Reviewed: 03/05/2011 [facility] Patient Information 2015 [place], [place]. This information is not  intended to replace advice given to you by your health care provider. Make sure you discuss any questions you have with your health care provider.

## 2013-09-23 NOTE — Progress Notes (Signed)
Discharge instructions reviewed with patient.  Patient states understanding of home care, medications, activity, signs/symptoms to report to MD and return MD office visit.  Patient states significant other will assist with her care at home.  No home equipment needed.  Patient ambulated for discharge in stable condition with staff without incident.

## 2013-09-23 NOTE — ED Provider Notes (Signed)
I saw and evaluated the patient, reviewed the resident's note and I agree with the findings and plan.   .Face to face Exam:  General:  Awake HEENT:  Atraumatic Resp:  Normal effort Abd:  Nondistended Neuro:No focal weakness  Dot Lanes, MD 09/23/13 [phone]

## 2013-10-09 ENCOUNTER — Ambulatory Visit (INDEPENDENT_AMBULATORY_CARE_PROVIDER_SITE_OTHER): Payer: Managed Care, Other (non HMO) | Admitting: Obstetrics & Gynecology

## 2013-10-09 VITALS — BP 140/58 | HR 70 | Temp 98.1°F | Ht 66.0 in | Wt 222.7 lb

## 2013-10-09 DIAGNOSIS — Z30019 Encounter for initial prescription of contraceptives, unspecified: Secondary | ICD-10-CM

## 2013-10-09 DIAGNOSIS — N7093 Salpingitis and oophoritis, unspecified: Secondary | ICD-10-CM

## 2013-10-09 DIAGNOSIS — Z3009 Encounter for other general counseling and advice on contraception: Secondary | ICD-10-CM

## 2013-10-09 MED ORDER — NORGESTIMATE-ETH ESTRADIOL 0.25-35 MG-MCG PO TABS
1.0000 | ORAL_TABLET | Freq: Every day | ORAL | Status: DC
Start: 2013-10-09 — End: 2013-12-06

## 2013-10-09 NOTE — Progress Notes (Signed)
   CLINIC ENCOUNTER NOTE  History:  33 y.o. G1P1001 here today for followup after admission for treatment of PID/TOA on 09/22/13- 09/23/13. Discharged home on Doxycycline and Metronidazole. She reports no symptoms.  Desires OCPs.  The following portions of the patient's history were reviewed and updated as appropriate: allergies, current medications, past family history, past medical history, past social history, past surgical history and problem list.  Review of Systems:  Pertinent items are noted in HPI.  Objective:  Physical Exam BP 140/58  Pulse 70  Temp(Src) 98.1 F (36.7 C) (Oral)  Ht 5\' 6"  (1.676 m)  Wt 222 lb 11.2 oz (101.016 kg)  BMI 35.96 kg/m2  LMP 10/01/2013 Gen: NAD Abd: Soft, nontender and nondistended Pelvic: Deferred  Labs and Imaging 09/22/2013    TRANSVAGINAL ULTRASOUND OF PELVIS CLINICAL DATA:  Follow-up tubo-ovarian abscess. Cervical tenderness.  TECHNIQUE: Transvaginal ultrasound examination of the pelvis was performed including evaluation of the uterus, ovaries, adnexal regions, and pelvic cul-de-sac.  COMPARISON:  None.  FINDINGS: Uterus  Measurements: 10 x 5 x 7 cm. As noted previously, there is a subserosal right uterine body heterogeneous mass measuring 3 cm, consistent with fibroid.  Endometrium  Thickness: 13 mm. The endometrium again appears heterogeneous and indistinct. This could be related to the patient's suspected pelvic inflammatory disease.  Right ovary  The right ovary is difficult to discern due to complex cystic mass within the right adnexa measuring 5.4 cm in diameter, previously 3 cm. The mass appears cystic with diffuse mid level echoes and mildly thickened wall. There is a neighboring more simple appearing cyst measuring 2.7 cm. No hydrosalpinx.  Left ovary  Measurements: 4.9 x 4 x 4 cm. The ovary is enlarged secondary to a simple appearing 3.8 x 2.7 x 2.4 cm cyst.  Other findings:  No free fluid  IMPRESSION: 1. Mild interval enlargement of a complex  cyst in the right ovary, now 5 cm in diameter. The appearance is nonspecific and could reflect a hemorrhagic cyst or, if there is clinical pelvic inflammatory disease, a tubo-ovarian complex/abscess. Note that hydrosalpinx is not visible. Regardless of therapy, followup imaging recommended to document normalization. 2. 4 cm left ovarian cyst which appears simple, favoring incidental follicular cyst. 3. 3 cm uterine fibroid.   Electronically Signed   By: Jorje Guild M.D.   On: 09/22/2013 21:07   Assessment & Plan:  Follow up scan needed in early 11/2013; patient unable to schedule for now due to job requirements. She will call and have it scheduled later.  Order placed. Sprintec prescribed, will do BP check in 2 months Patient to follow up for pap smear in 2 months or earlier as needed.    Verita Schneiders, MD, [place] Attending [place] for [employer], [place]

## 2013-10-09 NOTE — Patient Instructions (Signed)
Return to clinic for any scheduled appointments or for any gynecologic concerns as needed.   

## 2013-10-23 ENCOUNTER — Inpatient Hospital Stay (HOSPITAL_COMMUNITY): Payer: Managed Care, Other (non HMO)

## 2013-10-23 ENCOUNTER — Encounter (HOSPITAL_COMMUNITY): Payer: Self-pay

## 2013-10-23 ENCOUNTER — Inpatient Hospital Stay (HOSPITAL_COMMUNITY)
Admission: AD | Admit: 2013-10-23 | Discharge: 2013-10-26 | DRG: 759 | Disposition: A | Discharge status: Home / Self Care | Payer: Managed Care, Other (non HMO) | Source: Ambulatory Visit | Attending: Obstetrics & Gynecology | Admitting: Obstetrics & Gynecology

## 2013-10-23 DIAGNOSIS — N7093 Salpingitis and oophoritis, unspecified: Secondary | ICD-10-CM

## 2013-10-23 DIAGNOSIS — N8000 Endometriosis of the uterus, unspecified: Secondary | ICD-10-CM

## 2013-10-23 DIAGNOSIS — R630 Anorexia: Secondary | ICD-10-CM

## 2013-10-23 DIAGNOSIS — N946 Dysmenorrhea, unspecified: Secondary | ICD-10-CM

## 2013-10-23 DIAGNOSIS — E669 Obesity, unspecified: Secondary | ICD-10-CM | POA: Diagnosis present

## 2013-10-23 DIAGNOSIS — N949 Unspecified condition associated with female genital organs and menstrual cycle: Secondary | ICD-10-CM | POA: Diagnosis present

## 2013-10-23 DIAGNOSIS — Z6835 Body mass index (BMI) 35.0-35.9, adult: Secondary | ICD-10-CM

## 2013-10-23 DIAGNOSIS — R102 Pelvic and perineal pain: Secondary | ICD-10-CM | POA: Diagnosis present

## 2013-10-23 LAB — CBC
HEMATOCRIT: 34.4 % — AB (ref 36.0–46.0)
HEMOGLOBIN: 10.7 g/dL — AB (ref 12.0–15.0)
MCH: 24.8 pg — AB (ref 26.0–34.0)
MCHC: 31.1 g/dL (ref 30.0–36.0)
MCV: 79.8 fL (ref 78.0–100.0)
Platelets: 199 10*3/uL (ref 150–400)
RBC: 4.31 MIL/uL (ref 3.87–5.11)
RDW: 15.2 % (ref 11.5–15.5)
WBC: 11.3 10*3/uL — ABNORMAL HIGH (ref 4.0–10.5)

## 2013-10-23 LAB — DIFFERENTIAL
Basophils Absolute: 0 10*3/uL (ref 0.0–0.1)
Basophils Relative: 0 % (ref 0–1)
EOS ABS: 0.1 10*3/uL (ref 0.0–0.7)
Eosinophils Relative: 0 % (ref 0–5)
Lymphocytes Relative: 13 % (ref 12–46)
Lymphs Abs: 1.5 10*3/uL (ref 0.7–4.0)
MONO ABS: 0.3 10*3/uL (ref 0.1–1.0)
MONOS PCT: 3 % (ref 3–12)
Neutro Abs: 9.4 10*3/uL — ABNORMAL HIGH (ref 1.7–7.7)
Neutrophils Relative %: 84 % — ABNORMAL HIGH (ref 43–77)

## 2013-10-23 MED ORDER — DIPHENHYDRAMINE HCL 12.5 MG/5ML PO ELIX: 12.5000 mg | ORAL_SOLUTION | Freq: Four times a day (QID) | ORAL | Status: DC | PRN

## 2013-10-23 MED ORDER — DOXYCYCLINE HYCLATE 100 MG IV SOLR
100.0000 mg | Freq: Two times a day (BID) | INTRAVENOUS | Status: DC
  Administered 2013-10-23: 100 mg via INTRAVENOUS
  Filled 2013-10-23: qty 100

## 2013-10-23 MED ORDER — HYDROMORPHONE HCL PF 1 MG/ML IJ SOLN
1.0000 mg | INTRAMUSCULAR | Status: AC
  Administered 2013-10-23: 1 mg via INTRAMUSCULAR
  Filled 2013-10-23: qty 1

## 2013-10-23 MED ORDER — HYDROMORPHONE HCL PF 1 MG/ML IJ SOLN
1.0000 mg | INTRAMUSCULAR | Status: DC | PRN
  Administered 2013-10-23: 0.5 mg via INTRAVENOUS
  Administered 2013-10-23 – 2013-10-24 (×4): 1 mg via INTRAVENOUS
  Filled 2013-10-23 (×5): qty 1

## 2013-10-23 MED ORDER — ONDANSETRON HCL 4 MG/2ML IJ SOLN
4.0000 mg | Freq: Four times a day (QID) | INTRAMUSCULAR | Status: DC | PRN
  Administered 2013-10-24 (×2): 4 mg via INTRAVENOUS
  Filled 2013-10-23 (×2): qty 2

## 2013-10-23 MED ORDER — DIPHENHYDRAMINE HCL 50 MG/ML IJ SOLN: 12.5000 mg | Freq: Four times a day (QID) | INTRAMUSCULAR | Status: DC | PRN

## 2013-10-23 MED ORDER — KETOROLAC TROMETHAMINE 30 MG/ML IJ SOLN
30.0000 mg | Freq: Three times a day (TID) | INTRAMUSCULAR | Status: DC
  Administered 2013-10-23 – 2013-10-25 (×6): 30 mg via INTRAVENOUS
  Filled 2013-10-23 (×6): qty 1

## 2013-10-23 MED ORDER — DEXTROSE-NACL 5-0.45 % IV SOLN
INTRAVENOUS | Status: AC
  Administered 2013-10-23: 13:00:00 via INTRAVENOUS

## 2013-10-23 MED ORDER — HYDROMORPHONE HCL 2 MG PO TABS
2.0000 mg | ORAL_TABLET | ORAL | Status: DC | PRN
  Administered 2013-10-23 (×3): 2 mg via ORAL
  Filled 2013-10-23 (×3): qty 1

## 2013-10-23 MED ORDER — PIPERACILLIN-TAZOBACTAM 3.375 G IVPB
3.3750 g | Freq: Three times a day (TID) | INTRAVENOUS | Status: DC
  Administered 2013-10-23 – 2013-10-26 (×9): 3.375 g via INTRAVENOUS
  Filled 2013-10-23 (×12): qty 50

## 2013-10-23 MED ORDER — ZOLPIDEM TARTRATE 5 MG PO TABS
5.0000 mg | ORAL_TABLET | Freq: Every evening | ORAL | Status: DC | PRN
Start: 2013-10-23 — End: 2013-10-26

## 2013-10-23 MED ORDER — LACTATED RINGERS IV BOLUS (SEPSIS)
1000.0000 mL | Freq: Once | INTRAVENOUS | Status: AC
  Administered 2013-10-23: 1000 mL via INTRAVENOUS

## 2013-10-23 MED ORDER — HYDROMORPHONE HCL PF 1 MG/ML IJ SOLN
1.0000 mg | Freq: Once | INTRAMUSCULAR | Status: AC
  Administered 2013-10-23: 1 mg via INTRAVENOUS
  Filled 2013-10-23: qty 1

## 2013-10-23 MED ORDER — PROMETHAZINE HCL 25 MG/ML IJ SOLN
25.0000 mg | Freq: Once | INTRAMUSCULAR | Status: AC
  Administered 2013-10-23: 25 mg via INTRAMUSCULAR
  Filled 2013-10-23: qty 1

## 2013-10-23 MED ORDER — DOXYCYCLINE HYCLATE 100 MG PO TABS
100.0000 mg | ORAL_TABLET | Freq: Two times a day (BID) | ORAL | Status: DC
  Administered 2013-10-23 – 2013-10-25 (×5): 100 mg via ORAL
  Filled 2013-10-23 (×9): qty 1

## 2013-10-23 NOTE — MAU Provider Note (Signed)
History     CSN: [id number]  Arrival date and time: 10/23/13 0721   First Provider Initiated Contact with Patient 10/23/13 [phone]      Chief Complaint  Patient presents with  . Abdominal Pain   HPI Comments: Penny Stanley 33 y.o. G1P1001 presents to MAU with right sided pelvic pain that started back on June 26. She was eventually admitted with TOA and given Flagyl, Doxy, Keflex and pain medications. She was doing well on her office visit with Dr Harolyn Rutherford on 7/20. Last Monday after a Zumba class she started having some pains again that progressively got worse. This morning she hurt so bad she was not sure how she would get to hospital. She was given a mg of Dilaudid and phenergan 25 mg and pain is still 9/10.    Abdominal Pain      Past Medical History  Diagnosis Date  . Obesity     Past Surgical History  Procedure Laterality Date  . No past surgeries      No family history on file.  History  Substance Use Topics  . Smoking status: Never Smoker   . Smokeless tobacco: Not on file  . Alcohol Use: No    Allergies: No Known Allergies  Prescriptions prior to admission  Medication Sig Dispense Refill  . cephALEXin (KEFLEX) 500 MG capsule Take 1 capsule (500 mg total) by mouth 2 (two) times daily.  6 capsule  0  . docusate sodium 100 MG CAPS Take 100 mg by mouth 2 (two) times daily.  60 capsule  1  . doxycycline (VIBRA-TABS) 100 MG tablet Take 1 tablet (100 mg total) by mouth every 12 (twelve) hours.  20 tablet  0  . ibuprofen (ADVIL,MOTRIN) 600 MG tablet Take 1 tablet (600 mg total) by mouth every 6 (six) hours as needed (mild pain).  30 tablet  3  . norgestimate-ethinyl estradiol (ORTHO-CYCLEN,SPRINTEC,PREVIFEM) 0.25-35 MG-MCG tablet Take 1 tablet by mouth daily.  1 Package  11  . oxyCODONE-acetaminophen (PERCOCET/ROXICET) 5-325 MG per tablet Take 1 tablet by mouth every 4 (four) hours as needed for moderate pain or severe pain.  30 tablet  0    Review of Systems  HENT:  Negative.   Eyes: Negative.   Respiratory: Negative.   Cardiovascular: Negative.   Gastrointestinal: Positive for abdominal pain.  Genitourinary: Negative.   Musculoskeletal: Negative.   Skin: Negative.   Neurological: Negative.   Psychiatric/Behavioral: Negative.    Physical Exam   Blood pressure 117/61, pulse 85, temperature 97.7 F (36.5 C), temperature source Oral, resp. rate 18, last menstrual period 10/01/2013, SpO2 98.00%.  Physical Exam  Constitutional: She is oriented to person, place, and time. She appears well-developed and well-nourished. She appears distressed.  O2 sat 98%  HENT:  Head: Normocephalic and atraumatic.  Eyes: Pupils are equal, round, and reactive to light.  Cardiovascular: Normal rate, regular rhythm and normal heart sounds.   Respiratory: Effort normal and breath sounds normal.  GI: She exhibits distension. There is tenderness. There is guarding.  Unable to examine due to pain levels. Legs drawn to abdomen and she can not put then down  Musculoskeletal: Normal range of motion.  Neurological: She is alert and oriented to person, place, and time.  Skin: Skin is warm and dry.  Psychiatric: She has a normal mood and affect. Her behavior is normal. Judgment and thought content normal.    MAU Course  Procedures  MDM Dilaudid 1 mg Phenergan 25 mg Start IV LR  add dilaudid 1 mg Get CBC with diff Bedside U/S Dr Ihor Dow to see patient and advised admit with IV doxycycline and zosyn  Assessment and Plan   Admit to 3rd floor for Dr Rudene Re, Milas Kocher 10/23/2013, 8:37 AM

## 2013-10-23 NOTE — MAU Provider Note (Signed)
Attestation of Attending Supervision of Advanced Practitioner (CNM/NP): Evaluation and management procedures were performed by the Advanced Practitioner under my supervision and collaboration.  I have reviewed the Advanced Practitioner's note and chart, and I agree with the management and plan.  HARRAWAY-SMITH, Braelynne Garinger 5:23 PM

## 2013-10-23 NOTE — MAU Note (Signed)
Lynn Ito, RN Women's unit given pt report. Will have charge RN call back with pt room number.

## 2013-10-23 NOTE — MAU Note (Signed)
Pt states was admitted for tubo-ovarian abscess a few weeks ago. Here again with same pain on r lower abdomen. Denies bleeding or vag d/c changes.

## 2013-10-23 NOTE — H&P (Signed)
Chief Complaint   Patient presents with   .  Abdominal Pain   HPI Comments: Penny Stanley 33 y.o. G1P1001 presents to MAU with right sided pelvic pain that started back on June 26. Pt was initially treated as an outpatient with Zithromax x 1 and doxycycline.  She reports that her pain never really imporved and she was seen back at [facility] ED and was found to have bilateral TOA and was transferred to [facility] hops. She was treated with Cefotan and Doxycylcine for 24 hours and was discharged on Flagyl, Doxy, Keflex and pain medications.. She reports that her pain improved and she was doing [place].  Yesterday she developed progressively worsening pain. She reports nausea that began today with the pain only.  NOT associated with emesis.  She reports normal bowel function and has been passing gas.  She reports that she has had dsmenorrhea for as long as she can remember.  She had had issues with fertility ans has only cpnmceived once. She was recently started on OCP's to control the pain. She reports that her pregnancy was completely pain free.  For the past 1 year, she reports that the bleeding has increased to every 18 days and the pain has become worse.  She reports that her current pain is the same CHARACTER of pain that she has had in the past but, it is worse and lasting longer. When this episode of pain began she reports that she felt like her menses were about to begin.  She was given a mg of Dilaudid and phenergan 25 mg and pain is still 9/10.    By the time the patients was on the floor her pain was much improved (after the pain medication took effect).     Pt requesting regular diet.  Pt reports that she is completed with childbearing.  Abdominal Pain  Past Medical History   Diagnosis  Date   .  Obesity     Past Surgical History   Procedure  Laterality  Date   .  No past surgeries     No family history on file.  History   Substance Use Topics   .  Smoking status:  Never Smoker   .  Smokeless  tobacco:  Not on file   .  Alcohol Use:  No   Allergies: No Known Allergies  Prescriptions prior to admission   Medication  Sig  Dispense  Refill   .  cephALEXin (KEFLEX) 500 MG capsule  Take 1 capsule (500 mg total) by mouth 2 (two) times daily.  6 capsule  0   .  docusate sodium 100 MG CAPS  Take 100 mg by mouth 2 (two) times daily.  60 capsule  1   .  doxycycline (VIBRA-TABS) 100 MG tablet  Take 1 tablet (100 mg total) by mouth every 12 (twelve) hours.  20 tablet  0   .  ibuprofen (ADVIL,MOTRIN) 600 MG tablet  Take 1 tablet (600 mg total) by mouth every 6 (six) hours as needed (mild pain).  30 tablet  3   .  norgestimate-ethinyl estradiol (ORTHO-CYCLEN,SPRINTEC,PREVIFEM) 0.25-35 MG-MCG tablet  Take 1 tablet by mouth daily.  1 Package  11   .  oxyCODONE-acetaminophen (PERCOCET/ROXICET) 5-325 MG per tablet  Take 1 tablet by mouth every 4 (four) hours as needed for moderate pain or severe pain.  30 tablet  0   Review of Systems  HENT: Negative.  Eyes: Negative.  Respiratory: Negative.  Cardiovascular: Negative.  Gastrointestinal:  Positive for abdominal pain.  Genitourinary: Negative.  Musculoskeletal: Negative.  Skin: Negative.  Neurological: Negative.  Psychiatric/Behavioral: Negative.  Physical Exam   Blood pressure 117/61, pulse 85, temperature 97.7 F (36.5 C), temperature source Oral, resp. rate 18, last menstrual period 10/01/2013, SpO2 98.00%.   BP 124/67  Pulse 100  Temp(Src) 98.6 F (37 C) (Oral)  Resp 16  Ht 5\' 6"  (1.676 m)  Wt 222 lb (100.699 kg)  BMI 35.85 kg/m2  SpO2 99%  LMP 10/01/2013  Physical Exam  Constitutional: She is oriented to person, place, and time. She appears well-developed and well-nourished. She appears distressed. Improved after pain meds. O2 sat 98%  HENT:  Head: Normocephalic and atraumatic.  Eyes: Pupils are equal, round, and reactive to light.  Cardiovascular: Normal rate, regular rhythm and normal heart sounds.  Respiratory: Effort normal  and breath sounds normal.  Initial exam:GI: She exhibits distension. There is tenderness. There is guarding.  Unable to examine due to pain levels. Legs drawn to abdomen and she can not put then down  After pain meds: pts abd is soft, diffusely tender but, no rebound or guarding. Lying still in bed. Musculoskeletal: Normal range of motion.  Neurological: She is alert and oriented to person, place, and time.  Skin: Skin is warm and dry.  Psychiatric: She has a normal mood and affect. Her behavior is normal. Judgment and thought content normal.  MAU Course   Procedures  MDM  Dilaudid 1 mg  Phenergan 25 mg  Start IV LR add dilaudid 1 mg  Get CBC with diff  Bedside U/S  Dr Ihor Dow to see patient and advised admit with IV doxycycline and zosyn  Assessment and Plan   Possible reoccurrence of TOA's however, pt is afebrile and sono is improved from previously.  I suspect that if she had TOA's it is exacerbated by the presence of adenomyosis.  These could represent Endometriomas??? Although the sono suggects resolving hemorrhagic cysts. A MRI may further differentiate this.  Would wait until pain is reliably controlled prior to sending for MRI (perhaps as an outpt).  i have discussed with patient the possibility of operative eval after 24-48 hours of atbx or to be scheduled as an outpt if she improves quickly.    Admit to 3rd floor for Dr Ihor Dow  IV Zosyn and po doxycycline Regular diet MRI once comfortable to r/o adenomyosis (also need to assess possibility of endometriosis/endometriomas) D/W pt laparoscopy to eval adnexal masses with LAVH (if adenomyosis)

## 2013-10-23 NOTE — MAU Note (Signed)
Last food and drink at 2000 last pm.

## 2013-10-24 LAB — CBC
HCT: 30.7 % — ABNORMAL LOW (ref 36.0–46.0)
Hemoglobin: 10 g/dL — ABNORMAL LOW (ref 12.0–15.0)
MCH: 25.8 pg — AB (ref 26.0–34.0)
MCHC: 32.6 g/dL (ref 30.0–36.0)
MCV: 79.1 fL (ref 78.0–100.0)
PLATELETS: 129 10*3/uL — AB (ref 150–400)
RBC: 3.88 MIL/uL (ref 3.87–5.11)
RDW: 15.7 % — ABNORMAL HIGH (ref 11.5–15.5)
WBC: 10.6 10*3/uL — ABNORMAL HIGH (ref 4.0–10.5)

## 2013-10-24 NOTE — Progress Notes (Signed)
Subjective: Patient reports nausea, vomiting and no problems voiding.  The emesis she had was last night but, she feels hungry today. She feels that her pain is better but, not markedly so.  She still has a low grade temp.   Objective: I have reviewed patient's vital signs, intake and output, medications, microbiology and radiology results.  General: alert and no distress Resp: clear to auscultation bilaterally Cardio: regular rate and rhythm, S1, S2 normal, no murmur, click, rub or gallop GI: diffusely tender to palpation.  No rebound or guarding.  Extremities: extremities normal, atraumatic, no cyanosis or edema  CBC    Component Value Date/Time   WBC 10.6* 10/24/2013 0525   RBC 3.88 10/24/2013 0525   HGB 10.0* 10/24/2013 0525   HCT 30.7* 10/24/2013 0525   PLT 129* 10/24/2013 0525   MCV 79.1 10/24/2013 0525   MCH 25.8* 10/24/2013 0525   MCHC 32.6 10/24/2013 0525   RDW 15.7* 10/24/2013 0525   LYMPHSABS 1.5 10/23/2013 0912   MONOABS 0.3 10/23/2013 0912   EOSABS 0.1 10/23/2013 0912   BASOSABS 0.0 10/23/2013 0912   Cervical cx pending   Assessment/Plan: HD#2 for bilateral TOA with presumed adenomyosis vs endometriosis  Keep Zosyn and Doxycycline- would maintain IV atbx until pain is almost completely resolved given resurgence of pain and sx MRI as an outpt   LOS: 1 day    HARRAWAY-SMITH, Lurleen Soltero 10/24/2013, 7:35 AM

## 2013-10-24 NOTE — Progress Notes (Signed)
UR completed 

## 2013-10-25 LAB — CBC WITH DIFFERENTIAL/PLATELET
Basophils Absolute: 0 10*3/uL (ref 0.0–0.1)
Basophils Relative: 0 % (ref 0–1)
EOS ABS: 0.2 10*3/uL (ref 0.0–0.7)
EOS PCT: 2 % (ref 0–5)
HCT: 29.8 % — ABNORMAL LOW (ref 36.0–46.0)
Hemoglobin: 9.4 g/dL — ABNORMAL LOW (ref 12.0–15.0)
LYMPHS ABS: 2.2 10*3/uL (ref 0.7–4.0)
Lymphocytes Relative: 22 % (ref 12–46)
MCH: 25.1 pg — AB (ref 26.0–34.0)
MCHC: 31.5 g/dL (ref 30.0–36.0)
MCV: 79.5 fL (ref 78.0–100.0)
Monocytes Absolute: 0.5 10*3/uL (ref 0.1–1.0)
Monocytes Relative: 5 % (ref 3–12)
Neutro Abs: 6.9 10*3/uL (ref 1.7–7.7)
Neutrophils Relative %: 71 % (ref 43–77)
PLATELETS: 125 10*3/uL — AB (ref 150–400)
RBC: 3.75 MIL/uL — ABNORMAL LOW (ref 3.87–5.11)
RDW: 15.5 % (ref 11.5–15.5)
WBC: 9.7 10*3/uL (ref 4.0–10.5)

## 2013-10-25 LAB — GC/CHLAMYDIA PROBE AMP
CT Probe RNA: NEGATIVE
GC Probe RNA: NEGATIVE

## 2013-10-25 MED ORDER — IBUPROFEN 800 MG PO TABS
800.0000 mg | ORAL_TABLET | Freq: Three times a day (TID) | ORAL | Status: DC
  Administered 2013-10-25 (×3): 800 mg via ORAL
  Filled 2013-10-25 (×4): qty 1

## 2013-10-25 MED ORDER — OXYCODONE-ACETAMINOPHEN 5-325 MG PO TABS
1.0000 | ORAL_TABLET | Freq: Four times a day (QID) | ORAL | Status: DC | PRN
  Administered 2013-10-25: 1 via ORAL
  Filled 2013-10-25: qty 1

## 2013-10-25 NOTE — Progress Notes (Signed)
Subjective: Patient reports no problems voiding.  She reports that her pain is gradually getting better.  Her n/v has resolved but, she has poor appetite.  She reports that at rest her pain is almost gone.  But, with movement it is still intense   Objective: I have reviewed patient's vital signs, intake and output, medications and microbiology. GC/ Chlamydia neg BP 105/53  Pulse 93  Temp(Src) 98.8 F (37.1 C) (Oral)  Resp 18  Ht 5\' 6"  (1.676 m)  Wt 220 lb (99.791 kg)  BMI 35.53 kg/m2  SpO2 98%  LMP 10/01/2013  General: alert and no distress Resp: clear to auscultation bilaterally Cardio: regular rate and rhythm, S1, S2 normal, no murmur, click, rub or gallop GI: soft, still diffusely tender but, improved. No rebound or guarding  Extremities: Homans sign is negative, no sign of DVT  CBC    Component Value Date/Time   WBC 9.7 10/25/2013 0504   RBC 3.75* 10/25/2013 0504   HGB 9.4* 10/25/2013 0504   HCT 29.8* 10/25/2013 0504   PLT 125* 10/25/2013 0504   MCV 79.5 10/25/2013 0504   MCH 25.1* 10/25/2013 0504   MCHC 31.5 10/25/2013 0504   RDW 15.5 10/25/2013 0504   LYMPHSABS 2.2 10/25/2013 0504   MONOABS 0.5 10/25/2013 0504   EOSABS 0.2 10/25/2013 0504   BASOSABS 0.0 10/25/2013 0504    Assessment/Plan: Pt on Day #3 of Zosyn and doxycycline.  She is improving but, still has decreased appetite which suggests some inflammation of the bowel.   REC continued atbx  MRI as an outptatient  LOS: 2 days    Penny Stanley 10/25/2013, 10:58 AM

## 2013-10-26 LAB — BASIC METABOLIC PANEL
Anion gap: 9 (ref 5–15)
BUN: 11 mg/dL (ref 6–23)
CALCIUM: 8.5 mg/dL (ref 8.4–10.5)
CO2: 29 mEq/L (ref 19–32)
CREATININE: 0.77 mg/dL (ref 0.50–1.10)
Chloride: 102 mEq/L (ref 96–112)
GFR calc Af Amer: >90 mL/min (ref >90)
GLUCOSE: 84 mg/dL (ref 70–99)
Potassium: 3.6 mEq/L — ABNORMAL LOW (ref 3.7–5.3)
Sodium: 140 mEq/L (ref 137–147)

## 2013-10-26 MED ORDER — OXYCODONE-ACETAMINOPHEN 5-325 MG PO TABS: 1.0000 | ORAL_TABLET | Freq: Four times a day (QID) | ORAL | Status: DC | PRN

## 2013-10-26 MED ORDER — DOXYCYCLINE HYCLATE 100 MG PO TABS: 100.0000 mg | ORAL_TABLET | Freq: Two times a day (BID) | ORAL | Status: DC

## 2013-10-26 MED ORDER — IBUPROFEN 800 MG PO TABS: 800.0000 mg | ORAL_TABLET | Freq: Three times a day (TID) | ORAL | Status: DC

## 2013-10-26 MED ORDER — AMOXICILLIN-POT CLAVULANATE 875-125 MG PO TABS: 1.0000 | ORAL_TABLET | Freq: Two times a day (BID) | ORAL | Status: DC

## 2013-10-26 NOTE — Discharge Instructions (Signed)
Abscess An abscess is an infected area that contains a collection of pus and debris.It can occur in almost any part of the body. An abscess is also known as a furuncle or boil. CAUSES  An abscess occurs when tissue gets infected. This can occur from blockage of oil or sweat glands, infection of hair follicles, or a minor injury to the skin. As the body tries to fight the infection, pus collects in the area and creates pressure under the skin. This pressure causes pain. People with weakened immune systems have difficulty fighting infections and get certain abscesses more often.  SYMPTOMS Usually an abscess develops on the skin and becomes a painful mass that is red, warm, and tender. If the abscess forms under the skin, you may feel a moveable soft area under the skin. Some abscesses break open (rupture) on their own, but most will continue to get worse without care. The infection can spread deeper into the body and eventually into the bloodstream, causing you to feel ill.  DIAGNOSIS  Your caregiver will take your medical history and perform a physical exam. A sample of fluid may also be taken from the abscess to determine what is causing your infection. TREATMENT  Your caregiver may prescribe antibiotic medicines to fight the infection. However, taking antibiotics alone usually does not cure an abscess. Your caregiver may need to make a small cut (incision) in the abscess to drain the pus. In some cases, gauze is packed into the abscess to reduce pain and to continue draining the area. HOME CARE INSTRUCTIONS   Only take over-the-counter or prescription medicines for pain, discomfort, or fever as directed by your caregiver.  If you were prescribed antibiotics, take them as directed. Finish them even if you start to feel better.  If gauze is used, follow your caregiver's directions for changing the gauze.  To avoid spreading the infection:  Keep your draining abscess covered with a  bandage.  Wash your hands well.  Do not share personal care items, towels, or whirlpools with others.  Avoid skin contact with others.  Keep your skin and clothes clean around the abscess.  Keep all follow-up appointments as directed by your caregiver. SEEK MEDICAL CARE IF:   You have increased pain, swelling, redness, fluid drainage, or bleeding.  You have muscle aches, chills, or a general ill feeling.  You have a fever. MAKE SURE YOU:   Understand these instructions.  Will watch your condition.  Will get help right away if you are not doing well or get worse. Document Released: 12/17/2004 Document Revised: 09/08/2011 Document Reviewed: 05/22/2011 [facility] Patient Information 2015 [place], [place]. This information is not intended to replace advice given to you by your health care provider. Make sure you discuss any questions you have with your health care provider. Pelvic Inflammatory Disease Pelvic inflammatory disease (PID) refers to an infection in some or all of the female organs. The infection can be in the uterus, ovaries, fallopian tubes, or the surrounding tissues in the pelvis. PID can cause abdominal or pelvic pain that comes on suddenly (acute pelvic pain). PID is a serious infection because it can lead to lasting (chronic) pelvic pain or the inability to have children (infertile).  CAUSES  The infection is often caused by the normal bacteria found in the vaginal tissues. PID may also be caused by an infection that is spread during sexual contact. PID can also occur following:   The birth of a baby.   A miscarriage.  An abortion.   Major pelvic surgery.   The use of an intrauterine device (IUD).   A sexual assault.  RISK FACTORS Certain factors can put a person at higher risk for PID, such as:  Being younger than 25 years.  Being sexually active at [place] age.  Usingnonbarrier contraception.  Havingmultiple sexual partners.  Having sex with  someone who has symptoms of a genital infection.  Using oral contraception. Other times, certain behaviors can increase the possibility of getting PID, such as:  Having sex during your period.  Using a vaginal douche.  Having an intrauterine device (IUD) in place. SYMPTOMS   Abdominal or pelvic pain.   Fever.   Chills.   Abnormal vaginal discharge.  Abnormal uterine bleeding.   Unusual pain shortly after finishing your period. DIAGNOSIS  Your caregiver will choose some of the following methods to make a diagnosis, such as:   Performinga physical exam and history. A pelvic exam typically reveals a very tender uterus and surrounding pelvis.   Ordering laboratory tests including a pregnancy test, blood tests, and urine test.  Orderingcultures of the vagina and cervix to check for a sexually transmitted infection (STI).  Performing an ultrasound.   Performing a laparoscopic procedure to look inside the pelvis.  TREATMENT   Antibiotic medicines may be prescribed and taken by mouth.   Sexual partners may be treated when the infection is caused by a sexually transmitted disease (STD).   Hospitalization may be needed to give antibiotics intravenously.  Surgery may be needed, but this is rare. It may take weeks until you are completely well. If you are diagnosed with PID, you should also be checked for human immunodeficiency virus (HIV). HOME CARE INSTRUCTIONS   If given, take your antibiotics as directed. Finish the medicine even if you start to feel better.   Only take over-the-counter or prescription medicines for pain, discomfort, or fever as directed by your caregiver.   Do not have sexual intercourse until treatment is completed or as directed by your caregiver. If PID is confirmed, your recent sexual partner(s) will need treatment.   Keep your follow-up appointments. SEEK MEDICAL CARE IF:   You have increased or abnormal vaginal discharge.    You need prescription medicine for your pain.   You vomit.   You cannot take your medicines.   Your partner has an STD.  SEEK IMMEDIATE MEDICAL CARE IF:   You have a fever.   You have increased abdominal or pelvic pain.   You have chills.   You have pain when you urinate.   You are not better after 72 hours following treatment.  MAKE SURE YOU:   Understand these instructions.  Will watch your condition.  Will get help right away if you are not doing well or get worse. Document Released: 03/09/2005 Document Revised: 07/04/2012 Document Reviewed: 03/05/2011 [facility] Patient Information 2015 [place], [place]. This information is not intended to replace advice given to you by your health care provider. Make sure you discuss any questions you have with your health care provider.

## 2013-10-26 NOTE — Progress Notes (Signed)
Pt is discharged in the care of friend. Downstairs per ambulatory with [employer].Discharge instructions with Rx were given to pt. Questions were asked  And answered. No equipment needed for home use. Denies pain or discomfort.

## 2013-10-26 NOTE — Progress Notes (Signed)
Ur chart review completed.  

## 2013-10-26 NOTE — Discharge Summary (Signed)
Physician Discharge Summary  Patient ID: Penny Stanley MRN: [id number] DOB/AGE: May 24, 1980 33 y.o.  Admit date: 10/23/2013 Discharge date: 10/26/2013  Admission Diagnoses: bilateral TOA's; severe dysmenorrhea  Discharge Diagnoses:  Active Problems:   TOA (tubo-ovarian abscess)   Acute pelvic pain, female   Discharged Condition: good  Hospital Course: Pt was admitted for recurrent TOA's.  Although her sx had improved on outpt atbx she rebounded `2weeks after initial treatment.  She received IV atbx for 5 days and today her sx are resolved. She denies pain and eat 3 full meals yesterday with no nausea or emesis.  She will get a further workup to eval for adenomyosis as an outpt.  She is aware that she may need surgery pending the results of the MRI.       Consults: None  Significant Diagnostic Studies: labs: CBC, microbiology: cervical cx and radiology: Ultrasound: pelvic  Treatments: IV hydration and antibiotics: Zosyn and Doxycycline  Discharge Exam: Blood pressure 102/59, pulse 71, temperature 97.9 F (36.6 C), temperature source Oral, resp. rate 18, height 5\' 6"  (1.676 m), weight 221 lb (100.245 kg), last menstrual period 10/01/2013, SpO2 98.00%. General appearance: alert and no distress GI: soft, non-tender; bowel sounds normal; no masses,  no organomegaly  CBC    Component Value Date/Time   WBC 9.7 10/25/2013 0504   RBC 3.75* 10/25/2013 0504   HGB 9.4* 10/25/2013 0504   HCT 29.8* 10/25/2013 0504   PLT 125* 10/25/2013 0504   MCV 79.5 10/25/2013 0504   MCH 25.1* 10/25/2013 0504   MCHC 31.5 10/25/2013 0504   RDW 15.5 10/25/2013 0504   LYMPHSABS 2.2 10/25/2013 0504   MONOABS 0.5 10/25/2013 0504   EOSABS 0.2 10/25/2013 0504   BASOSABS 0.0 10/25/2013 0504     Disposition: 01-Home or Self Care  Discharge Instructions   Call MD for:  difficulty breathing, headache or visual disturbances    Complete by:  As directed      Call MD for:  hives    Complete by:  As directed      Call MD for:   persistant nausea and vomiting    Complete by:  As directed      Call MD for:  redness, tenderness, or signs of infection (pain, swelling, redness, odor or green/yellow discharge around incision site)    Complete by:  As directed      Call MD for:  severe uncontrolled pain    Complete by:  As directed      Call MD for:  temperature >100.4    Complete by:  As directed      Diet - low sodium heart healthy    Complete by:  As directed      Increase activity slowly    Complete by:  As directed             Medication List         amoxicillin-clavulanate 875-125 MG per tablet  Commonly known as:  AUGMENTIN  Take 1 tablet by mouth 2 (two) times daily.     doxycycline 100 MG tablet  Commonly known as:  VIBRA-TABS  Take 1 tablet (100 mg total) by mouth every 12 (twelve) hours.     ibuprofen 800 MG tablet  Commonly known as:  ADVIL,MOTRIN  Take 1 tablet (800 mg total) by mouth 3 (three) times daily.     norgestimate-ethinyl estradiol 0.25-35 MG-MCG tablet  Commonly known as:  ORTHO-CYCLEN,SPRINTEC,PREVIFEM  Take 1 tablet by mouth daily.  oxyCODONE-acetaminophen 5-325 MG per tablet  Commonly known as:  PERCOCET/ROXICET  Take 1-2 tablets by mouth every 6 (six) hours as needed for moderate pain or severe pain.           Follow-up Information   Follow up with Lavonia Drafts, MD In 2 weeks.   Specialty:  Obstetrics and Gynecology   Contact information:   [place] [address] [phone]     MRI to be scheduled as an outpt  Signed: HARRAWAY-SMITH, Yachet Mattson 10/26/2013, 9:14 AM

## 2013-11-01 ENCOUNTER — Telehealth: Payer: Self-pay

## 2013-11-01 DIAGNOSIS — R1900 Intra-abdominal and pelvic swelling, mass and lump, unspecified site: Secondary | ICD-10-CM

## 2013-11-01 NOTE — Telephone Encounter (Signed)
Patient called and left message stating she was discharged on 8/3 and her paperwork states she is to have a MRI on 8/13 at [facility] but when she called over there they stated nothing was scheduled. Patient would like [place] to call her back so she can figure out if she needs to be somewhere tomorrow or not and we can reach her at [phone]. Called [facility] and they state due to the patient's insurance they will not cover the cost of a MRI in the [place] system and we will have to use another facility. Called patient, no answer- left message that we are trying to reach you to return your phone call, please call us back at the clinics

## 2013-11-03 NOTE — Telephone Encounter (Signed)
[facility] and they received orders. Will have Penny Stanley call and schedule appointment that meets her schedule.   Called Penny Stanley and notified her orders were sent to [place] and she can call to schedule appointment.

## 2013-11-03 NOTE — Telephone Encounter (Addendum)
Penny Stanley left another message stating she had orders to have an MRI at [facility] , but they don't accept her insurance.-was told to call [facility]- states she called them and they do accept her insurance but they need [place] to put in the orders.  Wants [place] to call her back and let her know if we can do that.  Per chart review was ordered by Dr. Ihor Dow- will place new orders for [facility] (done).

## 2013-11-07 ENCOUNTER — Other Ambulatory Visit: Payer: Self-pay | Admitting: Pediatrics

## 2013-11-07 ENCOUNTER — Ambulatory Visit
Admission: RE | Admit: 2013-11-07 | Discharge: 2013-11-07 | Disposition: A | Discharge status: Home / Self Care | Payer: Managed Care, Other (non HMO) | Source: Ambulatory Visit | Attending: Obstetrics & Gynecology | Admitting: Obstetrics & Gynecology

## 2013-11-07 DIAGNOSIS — R1900 Intra-abdominal and pelvic swelling, mass and lump, unspecified site: Secondary | ICD-10-CM

## 2013-11-07 MED ORDER — GADOBENATE DIMEGLUMINE 529 MG/ML IV SOLN
19.0000 mL | Freq: Once | INTRAVENOUS | Status: AC | PRN
  Administered 2013-11-07: 19 mL via INTRAVENOUS

## 2013-11-13 ENCOUNTER — Ambulatory Visit (INDEPENDENT_AMBULATORY_CARE_PROVIDER_SITE_OTHER): Payer: Managed Care, Other (non HMO) | Admitting: Obstetrics & Gynecology

## 2013-11-13 ENCOUNTER — Encounter: Payer: Self-pay | Admitting: Obstetrics & Gynecology

## 2013-11-13 ENCOUNTER — Encounter: Payer: Self-pay | Admitting: *Deleted

## 2013-11-13 VITALS — BP 125/71 | HR 91 | Temp 98.4°F | Resp 20

## 2013-11-13 DIAGNOSIS — N801 Endometriosis of ovary: Secondary | ICD-10-CM

## 2013-11-13 DIAGNOSIS — N80109 Endometriosis of ovary, unspecified side, unspecified depth: Secondary | ICD-10-CM

## 2013-11-13 DIAGNOSIS — N80129 Deep endometriosis of ovary, unspecified ovary: Secondary | ICD-10-CM

## 2013-11-13 MED ORDER — LEUPROLIDE ACETATE (3 MONTH) 11.25 MG IM KIT: 11.2500 mg | PACK | Freq: Once | INTRAMUSCULAR | Status: DC

## 2013-11-13 MED ORDER — OXYCODONE-ACETAMINOPHEN 5-325 MG PO TABS: 1.0000 | ORAL_TABLET | Freq: Four times a day (QID) | ORAL | Status: DC | PRN

## 2013-11-13 MED ORDER — IBUPROFEN 800 MG PO TABS: 800.0000 mg | ORAL_TABLET | Freq: Three times a day (TID) | ORAL | Status: AC

## 2013-11-13 MED ORDER — LEUPROLIDE ACETATE (3 MONTH) 11.25 MG IM KIT
11.2500 mg | PACK | Freq: Once | INTRAMUSCULAR | Status: AC
  Administered 2013-11-13: 11.25 mg via INTRAMUSCULAR

## 2013-11-13 NOTE — Progress Notes (Signed)
Pt here to discuss MRI results

## 2013-11-13 NOTE — Patient Instructions (Addendum)
Leuprolide injection What is this medicine? LEUPROLIDE (loo PROE lide) is a man-made hormone. It is used to treat the symptoms of prostate cancer. This medicine may also be used to treat children with early onset of puberty. It may be used for other hormonal conditions. This medicine may be used for other purposes; ask your health care provider or pharmacist if you have questions. COMMON BRAND NAME(S): Lupron What should I tell my health care provider before I take this medicine? They need to know if you have any of these conditions: -diabetes -heart disease or previous heart attack -high blood pressure -high cholesterol -pain or difficulty passing urine -spinal cord metastasis -stroke -tobacco smoker -an unusual or allergic reaction to leuprolide, benzyl alcohol, other medicines, foods, dyes, or preservatives -pregnant or trying to get pregnant -breast-feeding How should I use this medicine? This medicine is for injection under the skin or into a muscle. You will be taught how to prepare and give this medicine. Use exactly as directed. Take your medicine at regular intervals. Do not take your medicine more often than directed. It is important that you put your used needles and syringes in a special sharps container. Do not put them in a trash can. If you do not have a sharps container, call your pharmacist or healthcare provider to get one. Talk to your pediatrician regarding the use of this medicine in children. While this medicine may be prescribed for children as young as 8 years for selected conditions, precautions do apply. Overdosage: If you think you have taken too much of this medicine contact a poison control center or emergency room at once. NOTE: This medicine is only for you. Do not share this medicine with others. What if I miss a dose? If you miss a dose, take it as soon as you can. If it is almost time for your next dose, take only that dose. Do not take double or extra  doses. What may interact with this medicine? Do not take this medicine with any of the following medications: -chasteberry This medicine may also interact with the following medications: -herbal or dietary supplements, like black cohosh or DHEA -female hormones, like estrogens or progestins and birth control pills, patches, rings, or injections -female hormones, like testosterone This list may not describe all possible interactions. Give your health care provider a list of all the medicines, herbs, non-prescription drugs, or dietary supplements you use. Also tell them if you smoke, drink alcohol, or use illegal drugs. Some items may interact with your medicine. What should I watch for while using this medicine? Visit your doctor or health care professional for regular checks on your progress. During the first week, your symptoms may get worse, but then will improve as you continue your treatment. You may get hot flashes, increased bone pain, increased difficulty passing urine, or an aggravation of nerve symptoms. Discuss these effects with your doctor or health care professional, some of them may improve with continued use of this medicine. Female patients may experience a menstrual cycle or spotting during the first 2 months of therapy with this medicine. If this continues, contact your doctor or health care professional. What side effects may I notice from receiving this medicine? Side effects that you should report to your doctor or health care professional as soon as possible: -allergic reactions like skin rash, itching or hives, swelling of the face, lips, or tongue -breathing problems -chest pain -depression or memory disorders -pain in your legs or groin -pain at site   where injected -severe headache -swelling of the feet and legs -visual changes -vomiting Side effects that usually do not require medical attention (report to your doctor or health care professional if they continue or are  bothersome): -breast swelling or tenderness -decrease in sex drive or performance -diarrhea -hot flashes -loss of appetite -muscle, joint, or bone pains -nausea -redness or irritation at site where injected -skin problems or acne This list may not describe all possible side effects. Call your doctor for medical advice about side effects. You may report side effects to FDA at 1-800-FDA-1088. Where should I keep my medicine? Keep out of the reach of children. Store below 25 degrees C (77 degrees F). Do not freeze. Protect from light. Do not use if it is not clear or if there are particles present. Throw away any unused medicine after the expiration date. NOTE: This sheet is a summary. It may not cover all possible information. If you have questions about this medicine, talk to your doctor, pharmacist, or health care provider.  2015, [employer] Standard. (2008-07-24 13:26:20) Endometriosis Endometriosis is a condition in which the tissue that lines the uterus (endometrium) grows outside of its normal location. The tissue may grow in many locations close to the uterus, but it commonly grows on the ovaries, fallopian tubes, vagina, or bowel. Because the uterus expels, or sheds, its lining every menstrual cycle, there is bleeding wherever the endometrial tissue is located. This can cause pain because blood is irritating to tissues not normally exposed to it.  CAUSES  The cause of endometriosis is not known.  SIGNS AND SYMPTOMS  Often, there are no symptoms. When symptoms are present, they can vary with the location of the displaced tissue. Various symptoms can occur at different times. Although symptoms occur mainly during a woman's menstrual period, they can also occur midcycle and usually stop with menopause. Some people may go months with no symptoms at all. Symptoms may include:   Back or abdominal pain.   Heavier bleeding during periods.   Pain during intercourse.   Painful bowel  movements.   Infertility. DIAGNOSIS  Your health care provider will do a physical exam and ask about your symptoms. Various tests may be done, such as:   Blood tests and urine tests. These are done to help rule out other problems.   Ultrasound. This test is done to look for abnormal tissue.   An X-ray of the lower bowel (barium enema).  Laparoscopy. In this procedure, a thin, lighted tube with a tiny camera on the end (laparoscope) is inserted into your abdomen. This helps your health care provider look for abnormal tissue to confirm the diagnosis. The health care provider may also remove a small piece of tissue (biopsy) from any abnormal tissue found. This tissue sample can then be sent to a lab so it can be looked at under a microscope. TREATMENT  Treatment will vary and may include:   Medicines to relieve pain. Nonsteroidal anti-inflammatory drugs (NSAIDs) are a type of pain medicine that can help to relieve the pain caused by endometriosis.  Hormonal therapy. When using hormonal therapy, periods are eliminated. This eliminates the monthly exposure to blood by the displaced endometrial tissue.   Surgery. Surgery may sometimes be done to remove the abnormal endometrial tissue. In severe cases, surgery may be done to remove the fallopian tubes, uterus, and ovaries (hysterectomy). HOME CARE INSTRUCTIONS   Take all medicines as directed by your health care provider. Do not take aspirin because it   may increase bleeding when you are not on hormonal therapy.   Avoid activities that produce pain, including sexual activity. SEEK MEDICAL CARE IF:  You have pelvic pain before, after, or during your periods.  You have pelvic pain between periods that gets worse during your period.  You have pelvic pain during or after sex.  You have pelvic pain with bowel movements or urination, especially during your period.  You have problems getting pregnant.  You have a fever. SEEK IMMEDIATE  MEDICAL CARE IF:   Your pain is severe and is not responding to pain medicine.   You have severe nausea and vomiting, or you cannot keep foods down.   You have pain that is limited to the right lower part of your abdomen.   You have swelling or increasing pain in your abdomen.   You see blood in your stool.  MAKE SURE YOU:   Understand these instructions.  Will watch your condition.  Will get help right away if you are not doing well or get worse. Document Released: 03/06/2000 Document Revised: 07/24/2013 Document Reviewed: 11/04/2012 Exit[employer] Patient Information 2015 [employer]. This information is not intended to replace advice given to you by your health care provider. Make sure you discuss any questions you have with your health care provider.  

## 2013-11-13 NOTE — Progress Notes (Signed)
Subjective:     Patient ID: Penny Stanley, female   DOB: [birth year], 33 y.o.   MRN: [id number]  HPI Pt reports that her pain over the past 2 days has improved.  She is worried about having her next menses and the pain that will ensue.  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  She denies F/C/N/V      Review of Systems     Objective:   Physical Exam BP 125/71  Pulse 91  Temp(Src) 98.4 F (36.9 C) (Oral)  Resp 20  LMP 10/26/2013 Pt in NAD  Exam deferred  10/28/2013 CLINICAL DATA: Tubo-ovarian abscess, fever. Antibiotic treatment.  EXAM:  TRANSVAGINAL ULTRASOUND OF PELVIS  TECHNIQUE:  Transvaginal ultrasound examination of the pelvis was performed  including evaluation of the uterus, ovaries, adnexal regions, and  pelvic cul-de-sac.  COMPARISON: Pelvic ultrasound most recent 09/22/2013, CT  abdomen/pelvis 09/17/2013  FINDINGS:  Uterus  Measurements: 9.2 x 6.1 x 6.1 cm. Globular and inhomogeneous with  ill definition of the endometrial/ myometrial junction. Right  posterior uterine body subserosal/intramural fibroid measures 3.0 x  2.2 x 2.0 cm.  Endometrium  Thickness: 8 mm, inhomogeneous. Not well visualized, without focal  abnormality.  Right ovary  Measurements: Right adnexal complex thick-walled cystic lesion now  measuring overall 5.0 x 4.7 x 4.5 cm. A dominant cystic portion has  either resolved or is decreased in size, with dominant thick-walled  cystic component now measuring 3.5 x 3.5 x 2.0 cm. The right ovary  is not visualized separate from this area.  Left ovary  Measurements: 3.2 x 3.9 x 3.0 cm. Probable collapsing cyst with low  level internal echoes, 3.0 x 2.5 x 2.2 cm. This is smaller than  previously, most compatible with a resolving hemorrhagic cyst.  Other findings: Moderate free fluid is identified.  IMPRESSION:  Slight decrease in size of complex right adnexal cystic lesion.  Again,  primary differential considerations include tubo-ovarian  abscess although involuting hemorrhagic cyst may appear similar.  Endometrioma is less likely.  Decrease in size of dominant left ovarian cyst likely representing  involuting physiologic cyst.  Globular, poorly defined endometrial/myometrial interface which may  suggest adenomyosis. This could be confirmed with MRI performed on a  nonemergent basis as an outpatient.    11/07/2013 CLINICAL DATA: Pelvic pain pelvic pain. Bilateral cystic adnexal  lesions. Pelvic inflammatory disease. Uterine adenomyosis suspected  by ultrasound.  EXAM:  MRI PELVIS WITHOUT AND WITH CONTRAST  TECHNIQUE:  Multiplanar multisequence MR imaging of the pelvis was performed  both before and after administration of intravenous contrast.  CONTRAST: 87mL MULTIHANCE GADOBENATE DIMEGLUMINE 529 MG/ML IV SOLN  COMPARISON: CT on 09/17/2013 and ultrasound on 10/23/2013  FINDINGS:  Uterus: Measures7.8 x 6.6 x 7.6 cm. Retroflexed. A 3.2 cm subserosal  fibroid is seen in the right fundal region. No other myometrial  masses identified. Although the uterine myometrium does have some T2  heterogeneity, there is no definite abnormal thickening of the  myometrial junctional zone or cystic foci noted within the  myometrium to suggest adenomyosis. Endometrial stripe thickness  measures approximately 9 mm  Cervix: Appears normal.  Right ovary: A complex cystic lesion is seen in the right adnexa  which shows T1 hyperintensity and a few internal septations. This  measures 5.6 x 5.4 cm and has signal characteristics consistent with  an endometrioma. A second cystic lesion with several thin internal  septations is seen  anteriorly which is also contiguous with the  right adnexa, which measures 3.6 x 5.3 cm. This shows normal fluid  signal with mild enhancement of a few thin septations. No solid  mural nodules or thickened septations are seen. This has  indeterminate but  probably benign features, and could represent a  peritoneal inclusion cyst, hydrosalpinx, or cystic ovarian neoplasm.  Left ovary: A cystic ovarian lesion in the left adnexa measures 4.7  x 3.5 cm and shows T1 hyperintensity consistent with an  endometrioma.  Urinary Bladder: Unremarkable.  Pelvic Bowel: Unremarkable.  Other: No inflammatory process identified. No free fluid visualized.  IMPRESSION:  Bilateral endometriomas, measuring 5.6 cm on the right and 4.7 cm on  the left.  Second 5.3 cm indeterminate but probably benign cystic lesion in the  anterior right adnexal region. Differential diagnosis includes  peritoneal occlusion cyst, hydrosalpinx, and cystic ovarian  neoplasm. If not evaluated surgically, continued imaging followup by  MRI is recommended in 3 months.  3.2 cm right fundal uterine fibroid. No definite MR signs of uterine  adenomyosis.       Assessment:     Bilateral endometriomas- reviewed with pt treatment/management options.  Pt understands that these will need surgical management at some point.  She wants relief of the pain at present so will begin the Lupron. reviewed the [facility] Of Lupron       Plan:     Begin Lupron 11.25mg  IM x 1 now F/u in 3 months or  Sooner prn Percocet #30 Motrin 800 q 8 hours.

## 2013-12-06 ENCOUNTER — Emergency Department (HOSPITAL_COMMUNITY): Payer: Managed Care, Other (non HMO)

## 2013-12-06 ENCOUNTER — Emergency Department (HOSPITAL_COMMUNITY)
Admission: EM | Admit: 2013-12-06 | Discharge: 2013-12-06 | Disposition: A | Discharge status: Home / Self Care | Payer: Managed Care, Other (non HMO) | Attending: Emergency Medicine | Admitting: Emergency Medicine

## 2013-12-06 ENCOUNTER — Encounter (HOSPITAL_COMMUNITY): Payer: Self-pay | Admitting: Emergency Medicine

## 2013-12-06 DIAGNOSIS — R5383 Other fatigue: Secondary | ICD-10-CM

## 2013-12-06 DIAGNOSIS — R0609 Other forms of dyspnea: Secondary | ICD-10-CM | POA: Insufficient documentation

## 2013-12-06 DIAGNOSIS — Z87891 Personal history of nicotine dependence: Secondary | ICD-10-CM | POA: Insufficient documentation

## 2013-12-06 DIAGNOSIS — R197 Diarrhea, unspecified: Secondary | ICD-10-CM | POA: Insufficient documentation

## 2013-12-06 DIAGNOSIS — R51 Headache: Secondary | ICD-10-CM | POA: Diagnosis not present

## 2013-12-06 DIAGNOSIS — R0602 Shortness of breath: Secondary | ICD-10-CM | POA: Insufficient documentation

## 2013-12-06 DIAGNOSIS — Z791 Long term (current) use of non-steroidal anti-inflammatories (NSAID): Secondary | ICD-10-CM | POA: Insufficient documentation

## 2013-12-06 DIAGNOSIS — R110 Nausea: Secondary | ICD-10-CM | POA: Insufficient documentation

## 2013-12-06 DIAGNOSIS — R0600 Dyspnea, unspecified: Secondary | ICD-10-CM

## 2013-12-06 DIAGNOSIS — R5381 Other malaise: Secondary | ICD-10-CM | POA: Diagnosis not present

## 2013-12-06 DIAGNOSIS — M549 Dorsalgia, unspecified: Secondary | ICD-10-CM | POA: Diagnosis not present

## 2013-12-06 DIAGNOSIS — R0989 Other specified symptoms and signs involving the circulatory and respiratory systems: Secondary | ICD-10-CM | POA: Diagnosis not present

## 2013-12-06 DIAGNOSIS — E669 Obesity, unspecified: Secondary | ICD-10-CM | POA: Diagnosis not present

## 2013-12-06 LAB — CBC
HCT: 36.1 % (ref 36.0–46.0)
Hemoglobin: 11.6 g/dL — ABNORMAL LOW (ref 12.0–15.0)
MCH: 24.5 pg — AB (ref 26.0–34.0)
MCHC: 32.1 g/dL (ref 30.0–36.0)
MCV: 76.3 fL — ABNORMAL LOW (ref 78.0–100.0)
PLATELETS: 186 10*3/uL (ref 150–400)
RBC: 4.73 MIL/uL (ref 3.87–5.11)
RDW: 13.8 % (ref 11.5–15.5)
WBC: 5.6 10*3/uL (ref 4.0–10.5)

## 2013-12-06 LAB — BASIC METABOLIC PANEL
ANION GAP: 13 (ref 5–15)
BUN: 13 mg/dL (ref 6–23)
CALCIUM: 9.2 mg/dL (ref 8.4–10.5)
CO2: 24 mEq/L (ref 19–32)
Chloride: 101 mEq/L (ref 96–112)
Creatinine, Ser: 0.77 mg/dL (ref 0.50–1.10)
Glucose, Bld: 130 mg/dL — ABNORMAL HIGH (ref 70–99)
Potassium: 3.5 mEq/L — ABNORMAL LOW (ref 3.7–5.3)
SODIUM: 138 meq/L (ref 137–147)

## 2013-12-06 LAB — I-STAT TROPONIN, ED: TROPONIN I, POC: 0 ng/mL (ref 0.00–0.08)

## 2013-12-06 MED ORDER — IOHEXOL 350 MG/ML SOLN
100.0000 mL | Freq: Once | INTRAVENOUS | Status: AC | PRN
  Administered 2013-12-06: 100 mL via INTRAVENOUS

## 2013-12-06 MED ORDER — SODIUM CHLORIDE 0.9 % IV BOLUS (SEPSIS)
2000.0000 mL | Freq: Once | INTRAVENOUS | Status: AC
  Administered 2013-12-06: 2000 mL via INTRAVENOUS

## 2013-12-06 NOTE — Discharge Instructions (Signed)
Shortness of Breath Shortness of breath means you have trouble breathing. It could also mean that you have a medical problem. You should get immediate medical care for shortness of breath. CAUSES   Not enough oxygen in the air such as with high altitudes or a smoke-filled room.  Certain lung diseases, infections, or problems.  Heart disease or conditions, such as angina or heart failure.  Low red blood cells (anemia).  Poor physical fitness, which can cause shortness of breath when you exercise.  Chest or back injuries or stiffness.  Being overweight.  Smoking.  Anxiety, which can make you feel like you are not getting enough air. DIAGNOSIS  Serious medical problems can often be found during your physical exam. Tests may also be done to determine why you are having shortness of breath. Tests may include:  Chest X-rays.  Lung function tests.  Blood tests.  An electrocardiogram (ECG).  An ambulatory electrocardiogram. An ambulatory ECG records your heartbeat patterns over a 24-hour period.  Exercise testing.  A transthoracic echocardiogram (TTE). During echocardiography, sound waves are used to evaluate how blood flows through your heart.  A transesophageal echocardiogram (TEE).  Imaging scans. Your health care provider may not be able to find a cause for your shortness of breath after your exam. In this case, it is important to have a follow-up exam with your health care provider as directed.  TREATMENT  Treatment for shortness of breath depends on the cause of your symptoms and can vary greatly. HOME CARE INSTRUCTIONS   Do not smoke. Smoking is a common cause of shortness of breath. If you smoke, ask for help to quit.  Avoid being around chemicals or things that may bother your breathing, such as paint fumes and dust.  Rest as needed. Slowly resume your usual activities.  If medicines were prescribed, take them as directed for the full length of time directed. This  includes oxygen and any inhaled medicines.  Keep all follow-up appointments as directed by your health care provider. SEEK MEDICAL CARE IF:   Your condition does not improve in the time expected.  You have a hard time doing your normal activities even with rest.  You have any new symptoms. SEEK IMMEDIATE MEDICAL CARE IF:   Your shortness of breath gets worse.  You feel light-headed, faint, or develop a cough not controlled with medicines.  You start coughing up blood.  You have pain with breathing.  You have chest pain or pain in your arms, shoulders, or abdomen.  You have a fever.  You are unable to walk up stairs or exercise the way you normally do. MAKE SURE YOU:  Understand these instructions.  Will watch your condition.  Will get help right away if you are not doing well or get worse. Document Released: 12/02/2000 Document Revised: 03/14/2013 Document Reviewed: 05/25/2011 [facility] Patient Information 2015 [place], [place]. This information is not intended to replace advice given to you by your health care provider. Make sure you discuss any questions you have with your health care provider.   Menopause Menopause is the normal time of life when menstrual periods stop completely. Menopause is complete when you have missed 12 consecutive menstrual periods. It usually occurs between the ages of 9 years and 41 years. Very rarely does a woman develop menopause before the age of 76 years. At menopause, your ovaries stop producing the female hormones estrogen and progesterone. This can cause undesirable symptoms and also affect your health. Sometimes the symptoms may occur  4-5 years before the menopause begins. There is no relationship between menopause and:  Oral contraceptives.  Number of children you had.  Race.  The age your menstrual periods started (menarche). Heavy smokers and very thin women may develop menopause earlier in life. CAUSES  The ovaries stop  producing the female hormones estrogen and progesterone.  Other causes include:  Surgery to remove both ovaries.  The ovaries stop functioning for no known reason.  Tumors of the pituitary gland in the brain.  Medical disease that affects the ovaries and hormone production.  Radiation treatment to the abdomen or pelvis.  Chemotherapy that affects the ovaries. SYMPTOMS   Hot flashes.  Night sweats.  Decrease in sex drive.  Vaginal dryness and thinning of the vagina causing painful intercourse.  Dryness of the skin and developing wrinkles.  Headaches.  Tiredness.  Irritability.  Memory problems.  Weight gain.  Bladder infections.  Hair growth of the face and chest.  Infertility. More serious symptoms include:  Loss of bone (osteoporosis) causing breaks (fractures).  Depression.  Hardening and narrowing of the arteries (atherosclerosis) causing heart attacks and strokes. DIAGNOSIS   When the menstrual periods have stopped for 12 straight months.  Physical exam.  Hormone studies of the blood. TREATMENT  There are many treatment choices and nearly as many questions about them. The decisions to treat or not to treat menopausal changes is an individual choice made with your health care provider. Your health care provider can discuss the treatments with you. Together, you can decide which treatment will work best for you. Your treatment choices may include:   Hormone therapy (estrogen and progesterone).  Non-hormonal medicines.  Treating the individual symptoms with medicine (for example antidepressants for depression).  Herbal medicines that may help specific symptoms.  Counseling by a psychiatrist or psychologist.  Group therapy.  Lifestyle changes including:  Eating healthy.  Regular exercise.  Limiting caffeine and alcohol.  Stress management and meditation.  No treatment. HOME CARE INSTRUCTIONS   Take the medicine your health care  provider gives you as directed.  Get plenty of sleep and rest.  Exercise regularly.  Eat a diet that contains calcium (good for the bones) and soy products (acts like estrogen hormone).  Avoid alcoholic beverages.  Do not smoke.  If you have hot flashes, dress in layers.  Take supplements, calcium, and vitamin D to strengthen bones.  You can use over-the-counter lubricants or moisturizers for vaginal dryness.  Group therapy is sometimes very helpful.  Acupuncture may be helpful in some cases. SEEK MEDICAL CARE IF:   You are not sure you are in menopause.  You are having menopausal symptoms and need advice and treatment.  You are still having menstrual periods after age 18 years.  You have pain with intercourse.  Menopause is complete (no menstrual period for 12 months) and you develop vaginal bleeding.  You need a referral to a specialist (gynecologist, psychiatrist, or psychologist) for treatment. SEEK IMMEDIATE MEDICAL CARE IF:   You have severe depression.  You have excessive vaginal bleeding.  You fell and think you have a broken bone.  You have pain when you urinate.  You develop leg or chest pain.  You have a fast pounding heart beat (palpitations).  You have severe headaches.  You develop vision problems.  You feel a lump in your breast.  You have abdominal pain or severe indigestion. Document Released: 05/30/2003 Document Revised: 11/09/2012 Document Reviewed: 10/06/2012 [facility] Patient Information 2015 [place], [place]. This information  is not intended to replace advice given to you by your health care provider. Make sure you discuss any questions you have with your health care provider.    Diarrhea Diarrhea is frequent loose and watery bowel movements. It can cause you to feel weak and dehydrated. Dehydration can cause you to become tired and thirsty, have a dry mouth, and have decreased urination that often is dark yellow. Diarrhea is a sign of  another problem, most often an infection that will not last long. In most cases, diarrhea typically lasts 2-3 days. However, it can last longer if it is a sign of something more serious. It is important to treat your diarrhea as directed by your caregiver to lessen or prevent future episodes of diarrhea. CAUSES  Some common causes include:  Gastrointestinal infections caused by viruses, bacteria, or parasites.  Food poisoning or food allergies.  Certain medicines, such as antibiotics, chemotherapy, and laxatives.  Artificial sweeteners and fructose.  Digestive disorders. HOME CARE INSTRUCTIONS  Ensure adequate fluid intake (hydration): Have 1 cup (8 oz) of fluid for each diarrhea episode. Avoid fluids that contain simple sugars or sports drinks, fruit juices, whole milk products, and sodas. Your urine should be clear or pale yellow if you are drinking enough fluids. Hydrate with an oral rehydration solution that you can purchase at pharmacies, retail stores, and online. You can prepare an oral rehydration solution at home by mixing the following ingredients together:   - tsp table salt.   tsp baking soda.   tsp salt substitute containing potassium chloride.  1  tablespoons sugar.  1 L (34 oz) of water.  Certain foods and beverages may increase the speed at which food moves through the gastrointestinal (GI) tract. These foods and beverages should be avoided and include:  Caffeinated and alcoholic beverages.  High-fiber foods, such as raw fruits and vegetables, nuts, seeds, and whole grain breads and cereals.  Foods and beverages sweetened with sugar alcohols, such as xylitol, sorbitol, and mannitol.  Some foods may be well tolerated and may help thicken stool including:  Starchy foods, such as rice, toast, pasta, low-sugar cereal, oatmeal, grits, baked potatoes, crackers, and bagels.  Bananas.  Applesauce.  Add probiotic-rich foods to help increase healthy bacteria in the  GI tract, such as yogurt and fermented milk products.  Wash your hands well after each diarrhea episode.  Only take over-the-counter or prescription medicines as directed by your caregiver.  Take a warm bath to relieve any burning or pain from frequent diarrhea episodes. SEEK IMMEDIATE MEDICAL CARE IF:   You are unable to keep fluids down.  You have persistent vomiting.  You have blood in your stool, or your stools are black and tarry.  You do not urinate in 6-8 hours, or there is only a small amount of very dark urine.  You have abdominal pain that increases or localizes.  You have weakness, dizziness, confusion, or light-headedness.  You have a severe headache.  Your diarrhea gets worse or does not get better.  You have a fever or persistent symptoms for more than 2-3 days.  You have a fever and your symptoms suddenly get worse. MAKE SURE YOU:   Understand these instructions.  Will watch your condition.  Will get help right away if you are not doing well or get worse. Document Released: 02/27/2002 Document Revised: 07/24/2013 Document Reviewed: 11/15/2011 [facility] Patient Information 2015 [place], [place]. This information is not intended to replace advice given to you by your health care  provider. Make sure you discuss any questions you have with your health care provider.

## 2013-12-06 NOTE — ED Notes (Signed)
Pt. Also has a swollen area that is painful to touch  On her rt. Groin.

## 2013-12-06 NOTE — ED Notes (Signed)
IV removed per RN.

## 2013-12-06 NOTE — ED Provider Notes (Signed)
CSN: [id number]     Arrival date & time 12/06/13  1129 History   First MD Initiated Contact with Patient 12/06/13 1205     Chief Complaint  Patient presents with  . Shortness of Breath     (Consider location/radiation/quality/duration/timing/severity/associated sxs/prior Treatment) HPI 33 year old female presents with a chief complaint of dyspnea. She states that over the last 4 days she's been having nausea and diarrhea. She's been feeling fatigued and weak with headache and back pain as well. Denies chest pain. She was given a Lupron injection to help induce menopause for severe periods a couple weeks ago. Patient denies any leg swelling or leg pain. States minimal exertion makes her short of breath and weak and fatigued. Denies any cough or fevers. Has never had a history of blood clots. She has also been having hot flashes.  Past Medical History  Diagnosis Date  . Obesity    Past Surgical History  Procedure Laterality Date  . No past surgeries     History reviewed. No pertinent family history. History  Substance Use Topics  . Smoking status: Former Research scientist (life sciences)  . Smokeless tobacco: Former Systems developer    Quit date: 06/22/2007  . Alcohol Use: No   OB History   Grav Para Term Preterm Abortions TAB SAB Ect Mult Living   1 1 1       1      Review of Systems  Constitutional: Positive for fatigue. Negative for fever.  Respiratory: Positive for shortness of breath. Negative for cough.   Cardiovascular: Negative for chest pain and leg swelling.  Gastrointestinal: Positive for nausea and diarrhea. Negative for vomiting and abdominal pain.  Genitourinary: Negative for dysuria.       Right groin swelling/tenderness  Musculoskeletal: Positive for back pain.  Neurological: Positive for weakness and headaches.  All other systems reviewed and are negative.     Allergies  Review of patient's allergies indicates no known allergies.  Home Medications   Prior to Admission medications     Medication Sig Start Date End Date Taking? Authorizing Provider  ibuprofen (ADVIL,MOTRIN) 800 MG tablet Take 1 tablet (800 mg total) by mouth 3 (three) times daily. 11/13/13  Yes Lavonia Drafts, MD  Leuprolide Acetate (LUPRON IJ) Inject as directed every 3 (three) months.   Yes Historical Provider, MD  oxyCODONE-acetaminophen (PERCOCET/ROXICET) 5-325 MG per tablet Take 1-2 tablets by mouth every 6 (six) hours as needed for moderate pain or severe pain. 11/13/13  Yes Lavonia Drafts, MD   BP 115/69  Temp(Src) 98.2 F (36.8 C) (Oral)  Resp 16  SpO2 100%  LMP 10/26/2013 Physical Exam  Nursing note and vitals reviewed. Constitutional: She is oriented to person, place, and time. She appears well-developed and well-nourished.  HENT:  Head: Normocephalic and atraumatic.  Right Ear: External ear normal.  Left Ear: External ear normal.  Nose: Nose normal.  Eyes: Right eye exhibits no discharge. Left eye exhibits no discharge.  Cardiovascular: Normal rate, regular rhythm and normal heart sounds.   Pulmonary/Chest: Effort normal and breath sounds normal. She has no wheezes. She has no rales.  Abdominal: Soft. She exhibits no distension. There is no tenderness.    Neurological: She is alert and oriented to person, place, and time.  Skin: Skin is warm and dry.    ED Course  Procedures (including critical care time) Labs Review Labs Reviewed  CBC - Abnormal; Notable for the following:    Hemoglobin 11.6 (*)    MCV 76.3 (*)  MCH 24.5 (*)    All other components within normal limits  BASIC METABOLIC PANEL - Abnormal; Notable for the following:    Potassium 3.5 (*)    Glucose, Bld 130 (*)    All other components within normal limits  URINALYSIS, ROUTINE W REFLEX MICROSCOPIC  I-STAT TROPOININ, ED    Imaging Review Dg Chest 2 View  12/06/2013   CLINICAL DATA:  Shortness of Breath  EXAM: CHEST  2 VIEW  COMPARISON:  None.  FINDINGS: Lungs are clear. The heart size and  pulmonary vascularity are normal. No adenopathy. No bone lesions.  IMPRESSION: No edema or consolidation.   Electronically Signed   By: Lowella Grip M.D.   On: 12/06/2013 12:23   Ct Angio Chest Pe W/cm &/or Wo Cm  12/06/2013   CLINICAL DATA:  Recent surgery.  Short of breath.  EXAM: CT ANGIOGRAPHY CHEST WITH CONTRAST  TECHNIQUE: Multidetector CT imaging of the chest was performed using the standard protocol during bolus administration of intravenous contrast. Multiplanar CT image reconstructions and MIPs were obtained to evaluate the vascular anatomy.  CONTRAST:  140mL OMNIPAQUE IOHEXOL 350 MG/ML SOLN  COMPARISON:  Radiograph 12/06/2013  FINDINGS: No filling defects within the pulmonary arteries to suggest acute pulmonary embolism. No acute findings aorta great vessels. No pericardial fluid. Esophagus is normal.  No axillary supraclavicular lymphadenopathy. No mediastinal adenopathy  Review of the lung parenchyma demonstrates no pneumothorax or pleural fluid. No infarction or infiltrate. Airways are normal. Review of the upper abdomen is unremarkable. Limited view of the skeleton is normal.  Review of the MIP images confirms the above findings.  IMPRESSION: No evidence acute pulmonary embolism.   Electronically Signed   By: Suzy Bouchard M.D.   On: 12/06/2013 14:36     EKG Interpretation   Date/Time:  Wednesday December 06 2013 11:45:58 EDT Ventricular Rate:  113 PR Interval:    QRS Duration: 78 QT Interval:  466 QTC Calculation: 639 R Axis:   103 Text Interpretation:  Rightward axis Prolonged QT Abnormal ECG nonspecific T  waves No old tracing to compare Confirmed by Samson ([id number]) on  12/06/2013 12:01:15 PM      MDM   Final diagnoses:  Dyspnea    Patient's dyspnea is of uncertain etiology. While talking to her she is able to talk in quick, complete and fast sentences. She is not having any increased work of breathing. There is no chest pain or anginal symptoms. Given  her recent hormone changes a CT was evaluated to rule out PE and is negative for embolism or other causes of dyspnea. The patient appears well and has normal vital signs. Her lab work is unremarkable compared with previous. I discussed her case with the gynecologist on call, Dr. Gala Romney, the patient's request, this time not recommending estrogen as the patient was hoping for and feels her symptoms are coming from something else such as a virus given her diarrhea and dyspnea. If her symptoms are still going on she will contact her GYN in a couple days and then may prescribe estrogen then. At this point, however I feel emergent conditions at ruled out and she is stable for discharge.    Ephraim Hamburger, MD 12/06/13 [id number]

## 2013-12-06 NOTE — ED Notes (Signed)
Pt in c/o shortness of breath, back pain, headaches and dizziness for the last few days, symptoms started after getting a Lupron shot at [facility] hospital. Pt called her PMD and described her symptoms and they wanted her to come here for evaluation for possible medication reaction. Pt alert and oriented in triage, skin warm and dry, dyspnea with exertion noted.

## 2014-01-22 ENCOUNTER — Encounter (HOSPITAL_COMMUNITY): Payer: Self-pay | Admitting: Emergency Medicine

## 2014-03-06 ENCOUNTER — Ambulatory Visit (INDEPENDENT_AMBULATORY_CARE_PROVIDER_SITE_OTHER): Payer: Managed Care, Other (non HMO)

## 2014-03-06 ENCOUNTER — Other Ambulatory Visit: Payer: Self-pay | Admitting: Gynecology

## 2014-03-06 ENCOUNTER — Ambulatory Visit (INDEPENDENT_AMBULATORY_CARE_PROVIDER_SITE_OTHER): Payer: Managed Care, Other (non HMO) | Admitting: Gynecology

## 2014-03-06 ENCOUNTER — Other Ambulatory Visit (HOSPITAL_COMMUNITY)
Admission: RE | Admit: 2014-03-06 | Discharge: 2014-03-06 | Disposition: A | Discharge status: Home / Self Care | Payer: Managed Care, Other (non HMO) | Source: Ambulatory Visit | Attending: Gynecology | Admitting: Gynecology

## 2014-03-06 ENCOUNTER — Encounter: Payer: Self-pay | Admitting: Gynecology

## 2014-03-06 VITALS — BP 120/82 | Ht 65.0 in | Wt 230.0 lb

## 2014-03-06 DIAGNOSIS — N946 Dysmenorrhea, unspecified: Secondary | ICD-10-CM

## 2014-03-06 DIAGNOSIS — N83201 Unspecified ovarian cyst, right side: Secondary | ICD-10-CM

## 2014-03-06 DIAGNOSIS — Z1151 Encounter for screening for human papillomavirus (HPV): Secondary | ICD-10-CM | POA: Insufficient documentation

## 2014-03-06 DIAGNOSIS — N83202 Unspecified ovarian cyst, left side: Principal | ICD-10-CM

## 2014-03-06 DIAGNOSIS — Z124 Encounter for screening for malignant neoplasm of cervix: Secondary | ICD-10-CM

## 2014-03-06 DIAGNOSIS — N854 Malposition of uterus: Secondary | ICD-10-CM

## 2014-03-06 DIAGNOSIS — D251 Intramural leiomyoma of uterus: Secondary | ICD-10-CM

## 2014-03-06 DIAGNOSIS — N8320 Unspecified ovarian cysts: Secondary | ICD-10-CM

## 2014-03-06 DIAGNOSIS — Z01419 Encounter for gynecological examination (general) (routine) without abnormal findings: Secondary | ICD-10-CM | POA: Insufficient documentation

## 2014-03-06 DIAGNOSIS — R102 Pelvic and perineal pain: Secondary | ICD-10-CM | POA: Insufficient documentation

## 2014-03-06 DIAGNOSIS — R1900 Intra-abdominal and pelvic swelling, mass and lump, unspecified site: Secondary | ICD-10-CM

## 2014-03-06 DIAGNOSIS — N920 Excessive and frequent menstruation with regular cycle: Secondary | ICD-10-CM

## 2014-03-06 LAB — CBC WITH DIFFERENTIAL/PLATELET
BASOS ABS: 0 10*3/uL (ref 0.0–0.1)
BASOS PCT: 0 % (ref 0–1)
Eosinophils Absolute: 0.2 10*3/uL (ref 0.0–0.7)
Eosinophils Relative: 2 % (ref 0–5)
HCT: 36.8 % (ref 36.0–46.0)
Hemoglobin: 12.5 g/dL (ref 12.0–15.0)
Lymphocytes Relative: 29 % (ref 12–46)
Lymphs Abs: 2.3 10*3/uL (ref 0.7–4.0)
MCH: 25.4 pg — AB (ref 26.0–34.0)
MCHC: 34 g/dL (ref 30.0–36.0)
MCV: 74.8 fL — ABNORMAL LOW (ref 78.0–100.0)
MPV: 8.4 fL — AB (ref 9.4–12.4)
Monocytes Absolute: 0.5 10*3/uL (ref 0.1–1.0)
Monocytes Relative: 6 % (ref 3–12)
Neutro Abs: 5 10*3/uL (ref 1.7–7.7)
Neutrophils Relative %: 63 % (ref 43–77)
PLATELETS: 250 10*3/uL (ref 150–400)
RBC: 4.92 MIL/uL (ref 3.87–5.11)
RDW: 15.5 % (ref 11.5–15.5)
WBC: 7.9 10*3/uL (ref 4.0–10.5)

## 2014-03-06 MED ORDER — TRAMADOL HCL 50 MG PO TABS: 50.0000 mg | ORAL_TABLET | Freq: Four times a day (QID) | ORAL | Status: DC | PRN

## 2014-03-06 MED ORDER — MEDROXYPROGESTERONE ACETATE 150 MG/ML IM SUSP
150.0000 mg | Freq: Once | INTRAMUSCULAR | Status: AC
  Administered 2014-03-06: 150 mg via INTRAMUSCULAR

## 2014-03-06 NOTE — Addendum Note (Signed)
Addended by: Thurnell Garbe A on: 03/06/2014 04:50 PM   Modules accepted: Orders

## 2014-03-06 NOTE — Patient Instructions (Addendum)
Abdominal Hysterectomy Abdominal hysterectomy is a surgical procedure to remove your womb (uterus). Your uterus is the muscular organ that contains a developing baby. This surgery is done for many reasons. You may need an abdominal hysterectomy if you have cancer, growths (tumors), long-term pain, or bleeding. You may also have this procedure if your uterus has slipped down into your vagina (uterine prolapse). Depending on why you need an abdominal hysterectomy, you may also have other reproductive organs removed. These could include the part of your vagina that connects with your uterus (cervix), the organs that make eggs (ovaries), and the tubes that connect the ovaries to the uterus (fallopian tubes). LET [facility] CARE PROVIDER KNOW ABOUT:   Any allergies you have.  All medicines you are taking, including vitamins, herbs, eye drops, creams, and over-the-counter medicines.  Previous problems you or members of your family have had with the use of anesthetics.  Any blood disorders you have.  Previous surgeries you have had.  Medical conditions you have. RISKS AND COMPLICATIONS Generally, this is a safe procedure. However, as with any procedure, problems can occur. Infection is the most common problem after an abdominal hysterectomy. Other possible problems include:  Bleeding.  Formation of blood clots that may break free and travel to your lungs.  Injury to other organs near your uterus.  Nerve injury causing nerve pain.  Decreased interest in sex or pain during sexual intercourse. BEFORE THE PROCEDURE  Abdominal hysterectomy is a major surgical procedure. It can affect the way you feel about yourself. Talk to your health care provider about the physical and emotional changes hysterectomy may cause.  You may need to have blood work and X-rays done before surgery.  Quit smoking if you smoke. Ask your health care provider for help if you are struggling to quit.  Stop taking  medicines that thin your blood as directed by your health care provider.  You may be instructed to take antibiotic medicines or laxatives before surgery.  Do not eat or drink anything for 6-8 hours before surgery.  Take your regular medicines with a small sip of water.  Bathe or shower the night or morning before surgery. PROCEDURE  Abdominal hysterectomy is done in the operating room at the hospital.  In most cases, you will be given a medicine that makes you go to sleep (general anesthetic).  The surgeon will make a cut (incision) through the skin in your lower belly.  The incision may be about 5-7 inches long. It may go side-to-side or up-and-down.  The surgeon will move aside the body tissue that covers your uterus. The surgeon will then carefully take out your uterus along with any of your other reproductive organs that need to be removed.  Bleeding will be controlled with clamps or sutures.  The surgeon will close your incision with sutures or metal clips. AFTER THE PROCEDURE  You will have some pain immediately after the procedure.  You will be given pain medicine in the recovery room.  You will be taken to your hospital room when you have recovered from the anesthesia.  You may need to stay in the hospital for 2-5 days.  You will be given instructions for recovery at home. Document Released: 03/14/2013 Document Reviewed: 03/14/2013 [facility] Patient Information 2015 [place], [place]. This information is not intended to replace advice given to you by your health care provider. Make sure you discuss any questions you have with your health care provider. Pelvic Inflammatory Disease Pelvic inflammatory disease (PID)  refers to an infection in some or all of the female organs. The infection can be in the uterus, ovaries, fallopian tubes, or the surrounding tissues in the pelvis. PID can cause abdominal or pelvic pain that comes on suddenly (acute pelvic pain). PID is a serious  infection because it can lead to lasting (chronic) pelvic pain or the inability to have children (infertile).  CAUSES  The infection is often caused by the normal bacteria found in the vaginal tissues. PID may also be caused by an infection that is spread during sexual contact. PID can also occur following:   The birth of a baby.   A miscarriage.   An abortion.   Major pelvic surgery.   The use of an intrauterine device (IUD).   A sexual assault.  RISK FACTORS Certain factors can put a person at higher risk for PID, such as:  Being younger than 25 years.  Being sexually active at [place] age.  Usingnonbarrier contraception.  Havingmultiple sexual partners.  Having sex with someone who has symptoms of a genital infection.  Using oral contraception. Other times, certain behaviors can increase the possibility of getting PID, such as:  Having sex during your period.  Using a vaginal douche.  Having an intrauterine device (IUD) in place. SYMPTOMS   Abdominal or pelvic pain.   Fever.   Chills.   Abnormal vaginal discharge.  Abnormal uterine bleeding.   Unusual pain shortly after finishing your period. DIAGNOSIS  Your caregiver will choose some of the following methods to make a diagnosis, such as:   Performinga physical exam and history. A pelvic exam typically reveals a very tender uterus and surrounding pelvis.   Ordering laboratory tests including a pregnancy test, blood tests, and urine test.  Orderingcultures of the vagina and cervix to check for a sexually transmitted infection (STI).  Performing an ultrasound.   Performing a laparoscopic procedure to look inside the pelvis.  TREATMENT   Antibiotic medicines may be prescribed and taken by mouth.   Sexual partners may be treated when the infection is caused by a sexually transmitted disease (STD).   Hospitalization may be needed to give antibiotics intravenously.  Surgery may be  needed, but this is rare. It may take weeks until you are completely well. If you are diagnosed with PID, you should also be checked for human immunodeficiency virus (HIV). HOME CARE INSTRUCTIONS   If given, take your antibiotics as directed. Finish the medicine even if you start to feel better.   Only take over-the-counter or prescription medicines for pain, discomfort, or fever as directed by your caregiver.   Do not have sexual intercourse until treatment is completed or as directed by your caregiver. If PID is confirmed, your recent sexual partner(s) will need treatment.   Keep your follow-up appointments. SEEK MEDICAL CARE IF:   You have increased or abnormal vaginal discharge.   You need prescription medicine for your pain.   You vomit.   You cannot take your medicines.   Your partner has an STD.  SEEK IMMEDIATE MEDICAL CARE IF:   You have a fever.   You have increased abdominal or pelvic pain.   You have chills.   You have pain when you urinate.   You are not better after 72 hours following treatment.  MAKE SURE YOU:   Understand these instructions.  Will watch your condition.  Will get help right away if you are not doing well or get worse. Document Released: 03/09/2005 Document Revised: 07/04/2012  Document Reviewed: 03/05/2011 [facility] Patient Information 2015 [place], [place]. This information is not intended to replace advice given to you by your health care provider. Make sure you discuss any questions you have with your health care provider. Endometriosis Endometriosis is a condition in which the tissue that lines the uterus (endometrium) grows outside of its normal location. The tissue may grow in many locations close to the uterus, but it commonly grows on the ovaries, fallopian tubes, vagina, or bowel. Because the uterus expels, or sheds, its lining every menstrual cycle, there is bleeding wherever the endometrial tissue is located. This can  cause pain because blood is irritating to tissues not normally exposed to it.  CAUSES  The cause of endometriosis is not known.  SIGNS AND SYMPTOMS  Often, there are no symptoms. When symptoms are present, they can vary with the location of the displaced tissue. Various symptoms can occur at different times. Although symptoms occur mainly during a woman's menstrual period, they can also occur midcycle and usually stop with menopause. Some people may go months with no symptoms at all. Symptoms may include:   Back or abdominal pain.   Heavier bleeding during periods.   Pain during intercourse.   Painful bowel movements.   Infertility. DIAGNOSIS  Your health care provider will do a physical exam and ask about your symptoms. Various tests may be done, such as:   Blood tests and urine tests. These are done to help rule out other problems.   Ultrasound. This test is done to look for abnormal tissue.   An X-ray of the lower bowel (barium enema).  Laparoscopy. In this procedure, a thin, lighted tube with a tiny camera on the end (laparoscope) is inserted into your abdomen. This helps your health care provider look for abnormal tissue to confirm the diagnosis. The health care provider may also remove a small piece of tissue (biopsy) from any abnormal tissue found. This tissue sample can then be sent to a lab so it can be looked at under a microscope. TREATMENT  Treatment will vary and may include:   Medicines to relieve pain. Nonsteroidal anti-inflammatory drugs (NSAIDs) are a type of pain medicine that can help to relieve the pain caused by endometriosis.  Hormonal therapy. When using hormonal therapy, periods are eliminated. This eliminates the monthly exposure to blood by the displaced endometrial tissue.   Surgery. Surgery may sometimes be done to remove the abnormal endometrial tissue. In severe cases, surgery may be done to remove the fallopian tubes, uterus, and ovaries  (hysterectomy). HOME CARE INSTRUCTIONS   Take all medicines as directed by your health care provider. Do not take aspirin because it may increase bleeding when you are not on hormonal therapy.   Avoid activities that produce pain, including sexual activity. SEEK MEDICAL CARE IF:  You have pelvic pain before, after, or during your periods.  You have pelvic pain between periods that gets worse during your period.  You have pelvic pain during or after sex.  You have pelvic pain with bowel movements or urination, especially during your period.  You have problems getting pregnant.  You have a fever. SEEK IMMEDIATE MEDICAL CARE IF:   Your pain is severe and is not responding to pain medicine.   You have severe nausea and vomiting, or you cannot keep foods down.   You have pain that is limited to the right lower part of your abdomen.   You have swelling or increasing pain in your abdomen.  You see blood in your stool.  MAKE SURE YOU:   Understand these instructions.  Will watch your condition.  Will get help right away if you are not doing well or get worse. Document Released: 03/06/2000 Document Revised: 07/24/2013 Document Reviewed: 11/04/2012 [facility] Patient Information 2015 [place], [place]. This information is not intended to replace advice given to you by your health care provider. Make sure you discuss any questions you have with your health care provider. Ovarian Cyst An ovarian cyst is a fluid-filled sac that forms on an ovary. The ovaries are small organs that produce eggs in women. Various types of cysts can form on the ovaries. Most are not cancerous. Many do not cause problems, and they often go away on their own. Some may cause symptoms and require treatment. Common types of ovarian cysts include:  Functional cysts--These cysts may occur every month during the menstrual cycle. This is normal. The cysts usually go away with the next menstrual cycle if the woman  does not get pregnant. Usually, there are no symptoms with a functional cyst.  Endometrioma cysts--These cysts form from the tissue that lines the uterus. They are also called "chocolate cysts" because they become filled with blood that turns brown. This type of cyst can cause pain in the lower abdomen during intercourse and with your menstrual period.  Cystadenoma cysts--This type develops from the cells on the outside of the ovary. These cysts can get very big and cause lower abdomen pain and pain with intercourse. This type of cyst can twist on itself, cut off its blood supply, and cause severe pain. It can also easily rupture and cause a lot of pain.  Dermoid cysts--This type of cyst is sometimes found in both ovaries. These cysts may contain different kinds of body tissue, such as skin, teeth, hair, or cartilage. They usually do not cause symptoms unless they get very big.  Theca lutein cysts--These cysts occur when too much of a certain hormone (human chorionic gonadotropin) is produced and overstimulates the ovaries to produce an egg. This is most common after procedures used to assist with the conception of a baby (in vitro fertilization). CAUSES   Fertility drugs can cause a condition in which multiple large cysts are formed on the ovaries. This is called ovarian hyperstimulation syndrome.  A condition called polycystic ovary syndrome can cause hormonal imbalances that can lead to nonfunctional ovarian cysts. SIGNS AND SYMPTOMS  Many ovarian cysts do not cause symptoms. If symptoms are present, they may include:  Pelvic pain or pressure.  Pain in the lower abdomen.  Pain during sexual intercourse.  Increasing girth (swelling) of the abdomen.  Abnormal menstrual periods.  Increasing pain with menstrual periods.  Stopping having menstrual periods without being pregnant. DIAGNOSIS  These cysts are commonly found during a routine or annual pelvic exam. Tests may be ordered to find  out more about the cyst. These tests may include:  Ultrasound.  X-ray of the pelvis.  CT scan.  MRI.  Blood tests. TREATMENT  Many ovarian cysts go away on their own without treatment. Your health care provider may want to check your cyst regularly for 2-3 months to see if it changes. For women in menopause, it is particularly important to monitor a cyst closely because of the higher rate of ovarian cancer in menopausal women. When treatment is needed, it may include any of the following:  A procedure to drain the cyst (aspiration). This may be done using a long needle and ultrasound.  It can also be done through a laparoscopic procedure. This involves using a thin, lighted tube with a tiny camera on the end (laparoscope) inserted through a small incision.  Surgery to remove the whole cyst. This may be done using laparoscopic surgery or an open surgery involving a larger incision in the lower abdomen.  Hormone treatment or birth control pills. These methods are sometimes used to help dissolve a cyst. HOME CARE INSTRUCTIONS   Only take over-the-counter or prescription medicines as directed by your health care provider.  Follow up with your health care provider as directed.  Get regular pelvic exams and Pap tests. SEEK MEDICAL CARE IF:   Your periods are late, irregular, or painful, or they stop.  Your pelvic pain or abdominal pain does not go away.  Your abdomen becomes larger or swollen.  You have pressure on your bladder or trouble emptying your bladder completely.  You have pain during sexual intercourse.  You have feelings of fullness, pressure, or discomfort in your stomach.  You lose weight for no apparent reason.  You feel generally ill.  You become constipated.  You lose your appetite.  You develop acne.  You have an increase in body and facial hair.  You are gaining weight, without changing your exercise and eating habits.  You think you are  pregnant. SEEK IMMEDIATE MEDICAL CARE IF:   You have increasing abdominal pain.  You feel sick to your stomach (nauseous), and you throw up (vomit).  You develop a fever that comes on suddenly.  You have abdominal pain during a bowel movement.  Your menstrual periods become heavier than usual. MAKE SURE YOU:  Understand these instructions.  Will watch your condition.  Will get help right away if you are not doing well or get worse. Document Released: 03/09/2005 Document Revised: 03/14/2013 Document Reviewed: 11/14/2012 [facility] Patient Information 2015 [place], [place]. This information is not intended to replace advice given to you by your health care provider. Make sure you discuss any questions you have with your health care provider. Tramadol tablets What is this medicine? TRAMADOL (TRA ma dole) is a pain reliever. It is used to treat moderate to severe pain in adults. This medicine may be used for other purposes; ask your health care provider or pharmacist if you have questions. COMMON BRAND NAME(S): Ultram What should I tell my health care provider before I take this medicine? They need to know if you have any of these conditions: -brain tumor -depression -drug abuse or addiction -head injury -if you frequently drink alcohol containing drinks -kidney disease or trouble passing urine -liver disease -lung disease, asthma, or breathing problems -seizures or epilepsy -suicidal thoughts, plans, or attempt; a previous suicide attempt by you or a family member -an unusual or allergic reaction to tramadol, codeine, other medicines, foods, dyes, or preservatives -pregnant or trying to get pregnant -breast-feeding How should I use this medicine? Take this medicine by mouth with a full glass of water. Follow the directions on the prescription label. If the medicine upsets your stomach, take it with food or milk. Do not take more medicine than you are told to take. Talk to your  pediatrician regarding the use of this medicine in children. Special care may be needed. Overdosage: If you think you have taken too much of this medicine contact a poison control center or emergency room at once. NOTE: This medicine is only for you. Do not share this medicine with others. What if I miss a dose? If  you miss a dose, take it as soon as you can. If it is almost time for your next dose, take only that dose. Do not take double or extra doses. What may interact with this medicine? Do not take this medicine with any of the following medications: -MAOIs like Carbex, Eldepryl, Marplan, Nardil, and Parnate This medicine may also interact with the following medications: -alcohol or medicines that contain alcohol -antihistamines -benzodiazepines -bupropion -carbamazepine or oxcarbazepine -clozapine -cyclobenzaprine -digoxin -furazolidone -linezolid -medicines for depression, anxiety, or psychotic disturbances -medicines for migraine headache like almotriptan, eletriptan, frovatriptan, naratriptan, rizatriptan, sumatriptan, zolmitriptan -medicines for pain like pentazocine, buprenorphine, butorphanol, meperidine, nalbuphine, and propoxyphene -medicines for sleep -muscle relaxants -naltrexone -phenobarbital -phenothiazines like perphenazine, thioridazine, chlorpromazine, mesoridazine, fluphenazine, prochlorperazine, promazine, and trifluoperazine -procarbazine -warfarin This list may not describe all possible interactions. Give your health care provider a list of all the medicines, herbs, non-prescription drugs, or dietary supplements you use. Also tell them if you smoke, drink alcohol, or use illegal drugs. Some items may interact with your medicine. What should I watch for while using this medicine? Tell your doctor or health care professional if your pain does not go away, if it gets worse, or if you have new or a different type of pain. You may develop tolerance to the medicine.  Tolerance means that you will need a higher dose of the medicine for pain relief. Tolerance is normal and is expected if you take this medicine for a long time. Do not suddenly stop taking your medicine because you may develop a severe reaction. Your body becomes used to the medicine. This does NOT mean you are addicted. Addiction is a behavior related to getting and using a drug for a non-medical reason. If you have pain, you have a medical reason to take pain medicine. Your doctor will tell you how much medicine to take. If your doctor wants you to stop the medicine, the dose will be slowly lowered over time to avoid any side effects. You may get drowsy or dizzy. Do not drive, use machinery, or do anything that needs mental alertness until you know how this medicine affects you. Do not stand or sit up quickly, especially if you are an older patient. This reduces the risk of dizzy or fainting spells. Alcohol can increase or decrease the effects of this medicine. Avoid alcoholic drinks. You may have constipation. Try to have a bowel movement at least every 2 to 3 days. If you do not have a bowel movement for 3 days, call your doctor or health care professional. Your mouth may get dry. Chewing sugarless gum or sucking hard candy, and drinking plenty of water may help. Contact your doctor if the problem does not go away or is severe. What side effects may I notice from receiving this medicine? Side effects that you should report to your doctor or health care professional as soon as possible: -allergic reactions like skin rash, itching or hives, swelling of the face, lips, or tongue -breathing difficulties, wheezing -confusion -itching -light headedness or fainting spells -redness, blistering, peeling or loosening of the skin, including inside the mouth -seizures Side effects that usually do not require medical attention (report to your doctor or health care professional if they continue or are  bothersome): -constipation -dizziness -drowsiness -headache -nausea, vomiting This list may not describe all possible side effects. Call your doctor for medical advice about side effects. You may report side effects to FDA at 1-800-FDA-1088. Where should I keep my medicine? Keep  out of the reach of children. Store at room temperature between 15 and 30 degrees C (59 and 86 degrees F). Keep container tightly closed. Throw away any unused medicine after the expiration date. NOTE: This sheet is a summary. It may not cover all possible information. If you have questions about this medicine, talk to your doctor, pharmacist, or health care provider.  2015, [employer]. (2009-11-20 11:55:44)

## 2014-03-06 NOTE — Progress Notes (Signed)
Patient is a 33 year old gravida 1 para 1 who presented to the office today as a new patient who is had a long-standing history of dysmenorrhea and menorrhagia and has presented to the emergency room several times this year as well as having been treated as an inpatient and outpatient for bilateral tubo-ovarian abscess. Patient recently had been given at the GYN clinic at [facility] hospital Lupron 11.25 mg in August of this year which has relieved her symptoms but she is dealing with issues of hot flashes. Patient has had several ultrasounds as well as CT and MRIs.  09/17/2013 CT of abdomen and pelvis: IMPRESSION: 1. Bilateral cystic masses are identified within the adnexal regions. If there are signs and symptoms of infection tubo-ovarian abscess cannot be excluded. Recommend further evaluation with pelvic sonogram. 2. The appendix is difficult to visualize separate from the right lower quadrant bowel wall loops.   09/17/2013 pelvic ultrasound: IMPRESSION: 1. Suspect pelvic inflammatory disease by CT. There are 2 mildly complex right adnexal cysts, up to 3 cm diameter, which could reflect tuboovarian abscess. 2. 4 cm left ovarian cyst which appears simple, favoring follicular cyst rather than abscess.  09/22/2013 pelvic ultrasound: IMPRESSION: 1. Mild interval enlargement of a complex cyst in the right ovary, now 5 cm in diameter. The appearance is nonspecific and could reflect a hemorrhagic cyst or, if there is clinical pelvic inflammatory disease, a tubo-ovarian complex/abscess. Note that hydrosalpinx is not visible. Regardless of therapy, followup imaging recommended to document normalization. 2. 4 cm left ovarian cyst which appears simple, favoring incidental follicular cyst. 3. 3 cm uterine fibroid.  Ultrasound 10/23/2013: IMPRESSION: Slight decrease in size of complex right adnexal cystic lesion. Again, primary differential considerations include tubo-ovarian abscess  although involuting hemorrhagic cyst may appear similar. Endometrioma is less likely.  Decrease in size of dominant left ovarian cyst likely representing involuting physiologic cyst.  Globular, poorly defined endometrial/myometrial interface which may suggest adenomyosis. This could be confirmed with MRI performed on a nonemergent basis as an outpatient.  MRI of pelvis August 2015: IMPRESSION: Bilateral endometriomas, measuring 5.6 cm on the right and 4.7 cm on the left.  Second 5.3 cm indeterminate but probably benign cystic lesion in the anterior right adnexal region. Differential diagnosis includes peritoneal occlusion cyst, hydrosalpinx, and cystic ovarian neoplasm. If not evaluated surgically, continued imaging followup by MRI is recommended in 3 months.  3.2 cm right fundal uterine fibroid. No definite MR signs of uterine Adenomyosis.  Patient with no acute distress today.  Exam: Abdomen: Soft mildly tender right lower quadrant but no rebound or guarding Pelvic: Bartholin urethra Skene was within normal limits Vagina: No lesions or discharge Uterus: Anteverted slightly irregular at the fundal region nontender Adnexa: No discernible palpable masses or tenderness although patient slightly overweight but no adnexal tenderness Rectal exam: Not done  A pelvic ultrasound was ordered in our office today to compare with previous study of 10/23/2013. Patient been treated as inpatient and outpatient with antibiotics several months ago and to see the response as well as from the Lupron.  Ultrasound today: Uterus measured 110.9 x 6.5 x 5.2 cm. The meter stripe 1.8 mm. Patient's uterus severely retroverted needed difficult to identify the endometrium. Patient had several intramural myomas the largest one measuring 20 x 22 mm. The right ovary had a thin-walled cystic mass with diffuse low level echoes homogeneous in appearance suspicious for endometrioma with a measurement of 5.2 x  4.7 x 5.4 cm. Left ovary thinwall cyst diffuse low level  homogeneous echoes possibly a smaller endometrioma measuring 2.5 x 1.0 x 2.6 and meters negative color flow. Both ovaries had arterial blood flow was noted. Questionable left cul-de-sac mass measuring 2.5 cm.  Assessment/plan: Patient with apparent bilateral endometriomas and fibroid uterus. Patient treated earlier this year for suspected bilateral tubo-ovarian abscesses. Patient suffers from dysmenorrhea and menorrhagia. Patient will be scheduled to undergo an abdominal hysterectomy with right cystectomy possible right salpingo-oophorectomy along with left cystectomy and left salpingectomy. Patient will be scheduled for sometime in January we will see her for preop consultation the week prior. Patient is wearing off the effects of the Lupron so she will receive Depo-Provera 150 mg IM today. Of note since it been more than 5 years since her last Pap smear one was done today. She was also prescribed Ultram 50 mm take 1 by mouth every 6 hours when necessary pain. Literature information was provided on endometriosis as well as on hysterectomy.

## 2014-03-08 LAB — CYTOLOGY - PAP

## 2014-03-09 ENCOUNTER — Telehealth: Payer: Self-pay

## 2014-03-09 NOTE — Telephone Encounter (Signed)
Left message for patient to call me regarding scheduling her hysterectomy.  I told her in vmail that Feb 9 first date available and I wanted to discuss that with her as well as her ins benefits.

## 2014-04-12 ENCOUNTER — Telehealth: Payer: Self-pay | Admitting: *Deleted

## 2014-04-12 MED ORDER — TRAMADOL HCL 50 MG PO TABS: 50.0000 mg | ORAL_TABLET | Freq: Four times a day (QID) | ORAL | Status: DC | PRN

## 2014-04-12 NOTE — Telephone Encounter (Signed)
Pt never received ultram 50 mg Rx from [facility] 03/06/14 because is was set on print and Rx must be called in. Rx will be called in.

## 2014-04-17 DIAGNOSIS — Z0289 Encounter for other administrative examinations: Secondary | ICD-10-CM

## 2014-04-23 DIAGNOSIS — E8940 Asymptomatic postprocedural ovarian failure: Secondary | ICD-10-CM

## 2014-04-23 HISTORY — DX: Asymptomatic postprocedural ovarian failure: E89.40

## 2014-04-23 NOTE — Patient Instructions (Signed)
   Your procedure is scheduled on:  Tuesday, Feb 16  Enter through the [employer] [facility] at: 6 AM Pick up the phone at the desk and dial [phone] and inform us of your arrival.  Please call this number if you have any problems the morning of surgery: [phone]  Remember: Do not eat or drink after midnight: Monday Take these medicines the morning of surgery with a SIP OF WATER:  None  Do not wear jewelry, make-up, or FINGER nail polish No metal in your hair or on your body. Do not wear lotions, powders, perfumes.  You may wear deodorant.  Do not bring valuables to the hospital. Contacts, dentures or bridgework may not be worn into surgery.  Leave suitcase in the car. After Surgery it may be brought to your room. For patients being admitted to the hospital, checkout time is 11:00am the day of discharge.

## 2014-04-24 ENCOUNTER — Inpatient Hospital Stay (HOSPITAL_COMMUNITY)
Admission: RE | Admit: 2014-04-24 | Discharge: 2014-04-24 | Disposition: A | Discharge status: Home / Self Care | Payer: Managed Care, Other (non HMO) | Source: Ambulatory Visit

## 2014-04-25 ENCOUNTER — Encounter (HOSPITAL_COMMUNITY): Payer: Self-pay

## 2014-04-25 ENCOUNTER — Encounter (HOSPITAL_COMMUNITY)
Admission: RE | Admit: 2014-04-25 | Discharge: 2014-04-25 | Disposition: A | Discharge status: Home / Self Care | Payer: Managed Care, Other (non HMO) | Source: Ambulatory Visit | Attending: Gynecology | Admitting: Gynecology

## 2014-04-25 DIAGNOSIS — N941 Dyspareunia: Secondary | ICD-10-CM | POA: Diagnosis not present

## 2014-04-25 DIAGNOSIS — Z01818 Encounter for other preprocedural examination: Secondary | ICD-10-CM | POA: Insufficient documentation

## 2014-04-25 DIAGNOSIS — R102 Pelvic and perineal pain: Secondary | ICD-10-CM | POA: Diagnosis not present

## 2014-04-25 DIAGNOSIS — N946 Dysmenorrhea, unspecified: Secondary | ICD-10-CM | POA: Diagnosis not present

## 2014-04-25 DIAGNOSIS — N801 Endometriosis of ovary: Secondary | ICD-10-CM | POA: Diagnosis not present

## 2014-04-25 HISTORY — DX: Reserved for inherently not codable concepts without codable children: IMO0001

## 2014-04-25 LAB — URINALYSIS, ROUTINE W REFLEX MICROSCOPIC
Bilirubin Urine: NEGATIVE
GLUCOSE, UA: NEGATIVE mg/dL
Hgb urine dipstick: NEGATIVE
Ketones, ur: NEGATIVE mg/dL
Leukocytes, UA: NEGATIVE
Nitrite: NEGATIVE
PH: 5.5 (ref 5.0–8.0)
Protein, ur: NEGATIVE mg/dL
Specific Gravity, Urine: >1.03 — ABNORMAL HIGH (ref 1.005–1.030)
UROBILINOGEN UA: 0.2 mg/dL (ref 0.0–1.0)

## 2014-04-25 LAB — CBC
HEMATOCRIT: 36.8 % (ref 36.0–46.0)
Hemoglobin: 11.8 g/dL — ABNORMAL LOW (ref 12.0–15.0)
MCH: 25.7 pg — AB (ref 26.0–34.0)
MCHC: 32.1 g/dL (ref 30.0–36.0)
MCV: 80 fL (ref 78.0–100.0)
PLATELETS: 199 10*3/uL (ref 150–400)
RBC: 4.6 MIL/uL (ref 3.87–5.11)
RDW: 14.5 % (ref 11.5–15.5)
WBC: 6 10*3/uL (ref 4.0–10.5)

## 2014-04-25 NOTE — Patient Instructions (Signed)
Your procedure is scheduled on:05/08/14  Enter through the Main Entrance at :6am Pick up desk phone and dial [phone] and inform us of your arrival.  Please call [phone] if you have any problems the morning of surgery.  Remember: Do not eat food or drink liquids, including water, after midnight:Monday 05/07/14 Clear liquids are ok until:   You may brush your teeth the morning of surgery.   DO NOT wear jewelry, eye make-up, lipstick,body lotion, or dark fingernail polish.  (Polished toes are ok) You may wear deodorant.  If you are to be admitted after surgery, leave suitcase in car until your room has been assigned. Patients discharged on the day of surgery will not be allowed to drive home. Wear loose fitting, comfortable clothes for your ride home.

## 2014-04-30 ENCOUNTER — Ambulatory Visit (INDEPENDENT_AMBULATORY_CARE_PROVIDER_SITE_OTHER): Payer: Managed Care, Other (non HMO) | Admitting: Gynecology

## 2014-04-30 ENCOUNTER — Telehealth: Payer: Self-pay

## 2014-04-30 ENCOUNTER — Encounter: Payer: Self-pay | Admitting: Gynecology

## 2014-04-30 ENCOUNTER — Other Ambulatory Visit: Payer: Self-pay | Admitting: Gynecology

## 2014-04-30 ENCOUNTER — Telehealth: Payer: Self-pay | Admitting: *Deleted

## 2014-04-30 VITALS — BP 128/76

## 2014-04-30 DIAGNOSIS — R102 Pelvic and perineal pain: Secondary | ICD-10-CM

## 2014-04-30 DIAGNOSIS — Z01818 Encounter for other preprocedural examination: Secondary | ICD-10-CM

## 2014-04-30 DIAGNOSIS — N80129 Deep endometriosis of ovary, unspecified ovary: Secondary | ICD-10-CM

## 2014-04-30 DIAGNOSIS — N801 Endometriosis of ovary: Secondary | ICD-10-CM

## 2014-04-30 DIAGNOSIS — N941 Dyspareunia: Secondary | ICD-10-CM

## 2014-04-30 DIAGNOSIS — N946 Dysmenorrhea, unspecified: Secondary | ICD-10-CM

## 2014-04-30 DIAGNOSIS — IMO0002 Reserved for concepts with insufficient information to code with codable children: Secondary | ICD-10-CM

## 2014-04-30 MED ORDER — PEG-KCL-NACL-NASULF-NA ASC-C 100 G PO SOLR: ORAL | Status: DC

## 2014-04-30 MED ORDER — TRAMADOL HCL 50 MG PO TABS: 50.0000 mg | ORAL_TABLET | Freq: Four times a day (QID) | ORAL | Status: DC | PRN

## 2014-04-30 MED ORDER — METOCLOPRAMIDE HCL 10 MG PO TABS: 10.0000 mg | ORAL_TABLET | Freq: Three times a day (TID) | ORAL | Status: DC

## 2014-04-30 MED ORDER — OXYCODONE-ACETAMINOPHEN 5-325 MG PO TABS: 1.0000 | ORAL_TABLET | Freq: Four times a day (QID) | ORAL | Status: DC | PRN

## 2014-04-30 NOTE — Telephone Encounter (Signed)
I reviewed need for bowel prep with patient. Also, need for clear liquid diet day before. I told her I am mailing written instructions for both.  Rx sent and she will call me if she has any questions.

## 2014-04-30 NOTE — Progress Notes (Signed)
Penny Stanley is an 34 y.o. female. For her preoperative consultation for her upcoming surgery. Her history is as follows:  Patient was seen in 03/06/2014. She is a gravida 1 para 1 with long-standing history of dysmenorrhea and menorrhagia and has presented to the emergency room several times this year as well as having been treated as an inpatient and outpatient for bilateral tubo-ovarian abscess. Patient recently had been given at the GYN clinic at [facility] hospital Lupron 11.25 mg in August of this year which has relieved her symptoms but she is dealing with issues of hot flashes. Patient has had several ultrasounds as well as CT and MRIs. (Patient with past hospitalization for intravenous antibiotic as well as patient antibiotic treatment) (  09/17/2013 CT of abdomen and pelvis: IMPRESSION: 1. Bilateral cystic masses are identified within the adnexal regions. If there are signs and symptoms of infection tubo-ovarian abscess cannot be excluded. Recommend further evaluation with pelvic sonogram. 2. The appendix is difficult to visualize separate from the right lower quadrant bowel wall loops.   09/17/2013 pelvic ultrasound: IMPRESSION: 1. Suspect pelvic inflammatory disease by CT. There are 2 mildly complex right adnexal cysts, up to 3 cm diameter, which could reflect tuboovarian abscess. 2. 4 cm left ovarian cyst which appears simple, favoring follicular cyst rather than abscess.  09/22/2013 pelvic ultrasound: IMPRESSION: 1. Mild interval enlargement of a complex cyst in the right ovary, now 5 cm in diameter. The appearance is nonspecific and could reflect a hemorrhagic cyst or, if there is clinical pelvic inflammatory disease, a tubo-ovarian complex/abscess. Note that hydrosalpinx is not visible. Regardless of therapy, followup imaging recommended to document normalization. 2. 4 cm left ovarian cyst which appears simple, favoring incidental follicular cyst. 3. 3 cm uterine  fibroid.  Ultrasound 10/23/2013: IMPRESSION: Slight decrease in size of complex right adnexal cystic lesion. Again, primary differential considerations include tubo-ovarian abscess although involuting hemorrhagic cyst may appear similar. Endometrioma is less likely.  Decrease in size of dominant left ovarian cyst likely representing involuting physiologic cyst.  Globular, poorly defined endometrial/myometrial interface which may suggest adenomyosis. This could be confirmed with MRI performed on a nonemergent basis as an outpatient.  MRI of pelvis August 2015: IMPRESSION: Bilateral endometriomas, measuring 5.6 cm on the right and 4.7 cm on the left.  Second 5.3 cm indeterminate but probably benign cystic lesion in the anterior right adnexal region. Differential diagnosis includes peritoneal occlusion cyst, hydrosalpinx, and cystic ovarian neoplasm. If not evaluated surgically, continued imaging followup by MRI is recommended in 3 months.  3.2 cm right fundal uterine fibroid. No definite MR signs of uterine Adenomyosis.  Patient with no acute distress today.  Exam: Abdomen: Soft mildly tender right lower quadrant but no rebound or guarding Pelvic: Bartholin urethra Skene was within normal limits Vagina: No lesions or discharge Uterus: Anteverted slightly irregular at the fundal region nontender Adnexa: No discernible palpable masses or tenderness although patient slightly overweight but no adnexal tenderness Rectal exam: Not done  A pelvic ultrasound was ordered in our office today to compare with previous study of 10/23/2013. Patient been treated as inpatient and outpatient with antibiotics several months ago and to see the response as well as from the Lupron.  Ultrasound December 15: Uterus measured 110.9 x 6.5 x 5.2 cm. The meter stripe 1.8 mm. Patient's uterus severely retroverted needed difficult to identify the endometrium. Patient had several intramural myomas the  largest one measuring 20 x 22 mm. The right ovary had a thin-walled cystic mass  with diffuse low level echoes homogeneous in appearance suspicious for endometrioma with a measurement of 5.2 x 4.7 x 5.4 cm. Left ovary thinwall cyst diffuse low level homogeneous echoes possibly a smaller endometrioma measuring 2.5 x 1.0 x 2.6 and meters negative color flow. Both ovaries had arterial blood flow was noted. Questionable left cul-de-sac mass measuring 2.5 cm.  Since she was wearing off the effects of the Depo-Lupron in December she was given a shot of Depo-Provera 150 mg IM. She is currently taking all TRAM 50 mg every 6 hours for her pain.   Patient was seen in the office on December 15. She is  Pertinent Gynecological History: Menses: Currently no menses since she took Lupron and now Depo-Provera Bleeding: None Contraception: condoms DES exposure: unknown Blood transfusions: none Sexually transmitted diseases: no past history Previous GYN Procedures: One vaginal delivery  Last mammogram: Not indicated Date: Not indicated Last pap: normal Date: 67 OB History: G 1, P 1   Menstrual History: Menarche age: 43 Patient's last menstrual period was 04/09/2013.    Past Medical History  Diagnosis Date  . Obesity   . Shortness of breath dyspnea     currently with exertion, ie climbing stairs- per pt this is recent development due to pain    Past Surgical History  Procedure Laterality Date  . No past surgeries      Family History  Problem Relation Age of Onset  . Diabetes Maternal Grandmother   . Hypertension Maternal Grandmother   . Hypertension Maternal Grandfather   . Diabetes Paternal Grandmother   . Hypertension Paternal Grandmother   . Hypertension Paternal Grandfather     Social History:  reports that she has quit smoking. She quit smokeless tobacco use about 6 years ago. She reports that she does not drink alcohol or use illicit drugs.  Allergies: No Known Allergies   (Not  in a hospital admission)  REVIEW OF SYSTEMS: A ROS was performed and pertinent positives and negatives are included in the history.  GENERAL: No fevers or chills. HEENT: No change in vision, no earache, sore throat or sinus congestion. NECK: No pain or stiffness. CARDIOVASCULAR: No chest pain or pressure. No palpitations. PULMONARY: No shortness of breath, cough or wheeze. GASTROINTESTINAL: No abdominal pain, nausea, vomiting or diarrhea, melena or bright red blood per rectum. GENITOURINARY: No urinary frequency, urgency, hesitancy or dysuria. MUSCULOSKELETAL: No joint or muscle pain, no back pain, no recent trauma. DERMATOLOGIC: No rash, no itching, no lesions. ENDOCRINE: No polyuria, polydipsia, no heat or cold intolerance. No recent change in weight. HEMATOLOGICAL: No anemia or easy bruising or bleeding. NEUROLOGIC: No headache, seizures, numbness, tingling or weakness. PSYCHIATRIC: No depression, no loss of interest in normal activity or change in sleep pattern.     Blood pressure 128/76, last menstrual period 04/09/2013.  Physical Exam:  HEENT:unremarkable Neck:Supple, midline, no thyroid megaly, no carotid bruits Lungs:  Clear to auscultation no rhonchi's or wheezes Heart:Regular rate and rhythm, no murmurs or gallops Breast Exam: Both breasts were examined sitting supine position there were symmetrical in appearance no skin discoloration or nipple inversion no palpable mass or tenderness no supra clavicular or axillary lymphadenopathy Abdomen: Soft tender lower abdomen no rebound or guarding Pelvic:BUS within normal limits Vagina: No lesions or discharge Cervix: No lesions or discharge Uterus: Firm non-mobile slightly anteverted tender at the area of the cul-de-sac Adnexa: Tenderness noted bilateral no rebound or guarding Extremities: No cords, no edema Rectal: Nontender no masses or lesion on rectal digital  exam   Assessment/plan:: Patient with apparent bilateral endometriomas and  fibroid uterus. Patient treated earlier this year for suspected bilateral tubo-ovarian abscesses. Patient suffers from dysmenorrhea and menorrhagia. Patient will be scheduled to undergo an abdominal hysterectomy with right cystectomy possible right salpingo-oophorectomy along with left cystectomy and left salpingectomy. Patient was counseled of the possibility in the event of severe endometriosis that she may lose both ovaries which may indicate that she would need hormone replacement therapy postop for several years to help with potential vasomotor symptoms and for bone health. We are going to place her on a bowel prep the day before surgery. Will have general surgeon on standby as well in the event that there may be involvement with her intestine and/or her appendix may need to be removed. Additional risk of the surgery as follows:                        Patient was counseled as to the risk of surgery to include the following:  1. Infection (prohylactic antibiotics will be administered)  2. DVT/Pulmonary Embolism (prophylactic pneumo compression stockings will be used)  3.Trauma to internal organs requiring additional surgical procedure to repair any injury to     Internal organs requiring perhaps additional hospitalization days.  4.Hemmorhage requiring transfusion and blood products which carry risks such as             anaphylactic reaction, hepatitis and AIDS  Patient had received literature information on the procedure scheduled and all her questions were answered and fully accepts all risk.   [facility] [id number] PMTD@Note :     Terrance Mass 04/30/2014, 1:42 PM  Note: This dictation was prepared with  Dragon/digital dictation along withSmart phrase technology. Any transcriptional errors that result from this process are unintentional.

## 2014-04-30 NOTE — Telephone Encounter (Signed)
Per Staff Message from Dr. Moshe Salisbury "Penny Stanley, for this patient in which is scheduled for next week I would like to place her on a 1 day bowel prep that we have used before. I also would like to have the general surgeons on standby for this patient with severe endometriosis in the event that they may be needed. Thank you "  I sent Rx for Movi-Prep to her pharmacy. I have mailed instructions for using bowel prep as well as clear liquid diet instructions.  I left message for patient to call me to discuss.

## 2014-04-30 NOTE — Telephone Encounter (Signed)
Pt Rx for ultram 50 mg must be called in to pharmacy from today visit, Rx was set on print. Rx will be called in

## 2014-04-30 NOTE — Patient Instructions (Signed)
Endometriosis Endometriosis is a condition in which the tissue that lines the uterus (endometrium) grows outside of its normal location. The tissue may grow in many locations close to the uterus, but it commonly grows on the ovaries, fallopian tubes, vagina, or bowel. Because the uterus expels, or sheds, its lining every menstrual cycle, there is bleeding wherever the endometrial tissue is located. This can cause pain because blood is irritating to tissues not normally exposed to it.  CAUSES  The cause of endometriosis is not known.  SIGNS AND SYMPTOMS  Often, there are no symptoms. When symptoms are present, they can vary with the location of the displaced tissue. Various symptoms can occur at different times. Although symptoms occur mainly during a woman's menstrual period, they can also occur midcycle and usually stop with menopause. Some people may go months with no symptoms at all. Symptoms may include:   Back or abdominal pain.   Heavier bleeding during periods.   Pain during intercourse.   Painful bowel movements.   Infertility. DIAGNOSIS  Your health care provider will do a physical exam and ask about your symptoms. Various tests may be done, such as:   Blood tests and urine tests. These are done to help rule out other problems.   Ultrasound. This test is done to look for abnormal tissue.   An X-ray of the lower bowel (barium enema).  Laparoscopy. In this procedure, a thin, lighted tube with a tiny camera on the end (laparoscope) is inserted into your abdomen. This helps your health care provider look for abnormal tissue to confirm the diagnosis. The health care provider may also remove a small piece of tissue (biopsy) from any abnormal tissue found. This tissue sample can then be sent to a lab so it can be looked at under a microscope. TREATMENT  Treatment will vary and may include:   Medicines to relieve pain. Nonsteroidal anti-inflammatory drugs (NSAIDs) are a type of  pain medicine that can help to relieve the pain caused by endometriosis.  Hormonal therapy. When using hormonal therapy, periods are eliminated. This eliminates the monthly exposure to blood by the displaced endometrial tissue.   Surgery. Surgery may sometimes be done to remove the abnormal endometrial tissue. In severe cases, surgery may be done to remove the fallopian tubes, uterus, and ovaries (hysterectomy). HOME CARE INSTRUCTIONS   Take all medicines as directed by your health care provider. Do not take aspirin because it may increase bleeding when you are not on hormonal therapy.   Avoid activities that produce pain, including sexual activity. SEEK MEDICAL CARE IF:  You have pelvic pain before, after, or during your periods.  You have pelvic pain between periods that gets worse during your period.  You have pelvic pain during or after sex.  You have pelvic pain with bowel movements or urination, especially during your period.  You have problems getting pregnant.  You have a fever. SEEK IMMEDIATE MEDICAL CARE IF:   Your pain is severe and is not responding to pain medicine.   You have severe nausea and vomiting, or you cannot keep foods down.   You have pain that is limited to the right lower part of your abdomen.   You have swelling or increasing pain in your abdomen.   You see blood in your stool.  MAKE SURE YOU:   Understand these instructions.  Will watch your condition.  Will get help right away if you are not doing well or get worse. Document Released: 03/06/2000 Document  Revised: 07/24/2013 Document Reviewed: 11/04/2012 [facility] Patient Information 2015 [place], [place]. This information is not intended to replace advice given to you by your health care provider. Make sure you discuss any questions you have with your health care provider. Abdominal Hysterectomy Abdominal hysterectomy is a surgical procedure to remove your womb (uterus). Your uterus is the  muscular organ that contains a developing baby. This surgery is done for many reasons. You may need an abdominal hysterectomy if you have cancer, growths (tumors), long-term pain, or bleeding. You may also have this procedure if your uterus has slipped down into your vagina (uterine prolapse). Depending on why you need an abdominal hysterectomy, you may also have other reproductive organs removed. These could include the part of your vagina that connects with your uterus (cervix), the organs that make eggs (ovaries), and the tubes that connect the ovaries to the uterus (fallopian tubes). LET [facility] CARE PROVIDER KNOW ABOUT:   Any allergies you have.  All medicines you are taking, including vitamins, herbs, eye drops, creams, and over-the-counter medicines.  Previous problems you or members of your family have had with the use of anesthetics.  Any blood disorders you have.  Previous surgeries you have had.  Medical conditions you have. RISKS AND COMPLICATIONS Generally, this is a safe procedure. However, as with any procedure, problems can occur. Infection is the most common problem after an abdominal hysterectomy. Other possible problems include:  Bleeding.  Formation of blood clots that may break free and travel to your lungs.  Injury to other organs near your uterus.  Nerve injury causing nerve pain.  Decreased interest in sex or pain during sexual intercourse. BEFORE THE PROCEDURE  Abdominal hysterectomy is a major surgical procedure. It can affect the way you feel about yourself. Talk to your health care provider about the physical and emotional changes hysterectomy may cause.  You may need to have blood work and X-rays done before surgery.  Quit smoking if you smoke. Ask your health care provider for help if you are struggling to quit.  Stop taking medicines that thin your blood as directed by your health care provider.  You may be instructed to take antibiotic  medicines or laxatives before surgery.  Do not eat or drink anything for 6-8 hours before surgery.  Take your regular medicines with a small sip of water.  Bathe or shower the night or morning before surgery. PROCEDURE  Abdominal hysterectomy is done in the operating room at the hospital.  In most cases, you will be given a medicine that makes you go to sleep (general anesthetic).  The surgeon will make a cut (incision) through the skin in your lower belly.  The incision may be about 5-7 inches long. It may go side-to-side or up-and-down.  The surgeon will move aside the body tissue that covers your uterus. The surgeon will then carefully take out your uterus along with any of your other reproductive organs that need to be removed.  Bleeding will be controlled with clamps or sutures.  The surgeon will close your incision with sutures or metal clips. AFTER THE PROCEDURE  You will have some pain immediately after the procedure.  You will be given pain medicine in the recovery room.  You will be taken to your hospital room when you have recovered from the anesthesia.  You may need to stay in the hospital for 2-5 days.  You will be given instructions for recovery at home. Document Released: 03/14/2013 Document Reviewed: 03/14/2013 [employer]  Patient Information 2015 [employer]. This information is not intended to replace advice given to you by your health care provider. Make sure you discuss any questions you have with your health care provider.  

## 2014-05-07 MED ORDER — DEXTROSE 5 % IV SOLN
2.0000 g | INTRAVENOUS | Status: AC
  Administered 2014-05-08: 2 g via INTRAVENOUS
  Filled 2014-05-07: qty 2

## 2014-05-08 ENCOUNTER — Inpatient Hospital Stay (HOSPITAL_COMMUNITY): Payer: Managed Care, Other (non HMO) | Admitting: Certified Registered Nurse Anesthetist

## 2014-05-08 ENCOUNTER — Encounter (HOSPITAL_COMMUNITY)
Admission: RE | Disposition: A | Discharge status: Home / Self Care | Payer: Self-pay | Source: Ambulatory Visit | Attending: Gynecology

## 2014-05-08 ENCOUNTER — Inpatient Hospital Stay (HOSPITAL_COMMUNITY)
Admission: RE | Admit: 2014-05-08 | Discharge: 2014-05-09 | DRG: 743 | Disposition: A | Discharge status: Home / Self Care | Payer: Managed Care, Other (non HMO) | Source: Ambulatory Visit | Attending: Gynecology | Admitting: Gynecology

## 2014-05-08 ENCOUNTER — Encounter (HOSPITAL_COMMUNITY): Payer: Self-pay | Admitting: Anesthesiology

## 2014-05-08 DIAGNOSIS — N920 Excessive and frequent menstruation with regular cycle: Secondary | ICD-10-CM | POA: Diagnosis present

## 2014-05-08 DIAGNOSIS — D259 Leiomyoma of uterus, unspecified: Secondary | ICD-10-CM | POA: Diagnosis present

## 2014-05-08 DIAGNOSIS — D649 Anemia, unspecified: Secondary | ICD-10-CM | POA: Diagnosis present

## 2014-05-08 DIAGNOSIS — N736 Female pelvic peritoneal adhesions (postinfective): Secondary | ICD-10-CM

## 2014-05-08 DIAGNOSIS — N803 Endometriosis of pelvic peritoneum: Secondary | ICD-10-CM | POA: Diagnosis present

## 2014-05-08 DIAGNOSIS — N801 Endometriosis of ovary: Secondary | ICD-10-CM

## 2014-05-08 DIAGNOSIS — Z9889 Other specified postprocedural states: Secondary | ICD-10-CM

## 2014-05-08 DIAGNOSIS — N946 Dysmenorrhea, unspecified: Secondary | ICD-10-CM | POA: Diagnosis present

## 2014-05-08 HISTORY — PX: ABDOMINAL HYSTERECTOMY: SHX81

## 2014-05-08 HISTORY — PX: BILATERAL SALPINGECTOMY: SHX5743

## 2014-05-08 HISTORY — PX: LYSIS OF ADHESION: SHX5961

## 2014-05-08 HISTORY — PX: COLOSTOMY REVISION: SHX5232

## 2014-05-08 LAB — CBC
HEMATOCRIT: 32.2 % — AB (ref 36.0–46.0)
Hemoglobin: 10.3 g/dL — ABNORMAL LOW (ref 12.0–15.0)
MCH: 26 pg (ref 26.0–34.0)
MCHC: 32 g/dL (ref 30.0–36.0)
MCV: 81.3 fL (ref 78.0–100.0)
PLATELETS: 178 10*3/uL (ref 150–400)
RBC: 3.96 MIL/uL (ref 3.87–5.11)
RDW: 15.4 % (ref 11.5–15.5)
WBC: 13.2 10*3/uL — ABNORMAL HIGH (ref 4.0–10.5)

## 2014-05-08 LAB — BASIC METABOLIC PANEL
ANION GAP: 2 — AB (ref 5–15)
BUN: 8 mg/dL (ref 6–23)
CALCIUM: 8.1 mg/dL — AB (ref 8.4–10.5)
CO2: 24 mmol/L (ref 19–32)
Chloride: 112 mmol/L (ref 96–112)
Creatinine, Ser: 0.73 mg/dL (ref 0.50–1.10)
GFR calc non Af Amer: >90 mL/min (ref >90)
Glucose, Bld: 145 mg/dL — ABNORMAL HIGH (ref 70–99)
Potassium: 4 mmol/L (ref 3.5–5.1)
Sodium: 138 mmol/L (ref 135–145)

## 2014-05-08 LAB — ABO/RH: ABO/RH(D): A POS

## 2014-05-08 LAB — TYPE AND SCREEN
ABO/RH(D): A POS
Antibody Screen: NEGATIVE

## 2014-05-08 LAB — PREGNANCY, URINE: Preg Test, Ur: NEGATIVE

## 2014-05-08 SURGERY — HYSTERECTOMY, ABDOMINAL
Anesthesia: General | Site: Abdomen

## 2014-05-08 MED ORDER — ONDANSETRON HCL 4 MG/2ML IJ SOLN
INTRAMUSCULAR | Status: AC
  Filled 2014-05-08: qty 2

## 2014-05-08 MED ORDER — PROPOFOL 10 MG/ML IV BOLUS
INTRAVENOUS | Status: AC
  Filled 2014-05-08: qty 20

## 2014-05-08 MED ORDER — PHENOL 1.4 % MT LIQD
1.0000 | OROMUCOSAL | Status: DC | PRN
  Administered 2014-05-08: 1 via OROMUCOSAL
  Filled 2014-05-08: qty 177

## 2014-05-08 MED ORDER — KETOROLAC TROMETHAMINE 30 MG/ML IJ SOLN
INTRAMUSCULAR | Status: AC
  Filled 2014-05-08: qty 1

## 2014-05-08 MED ORDER — LIDOCAINE HCL (CARDIAC) 20 MG/ML IV SOLN
INTRAVENOUS | Status: AC
  Filled 2014-05-08: qty 5

## 2014-05-08 MED ORDER — ROCURONIUM BROMIDE 100 MG/10ML IV SOLN
INTRAVENOUS | Status: DC | PRN
  Administered 2014-05-08: 20 mg via INTRAVENOUS
  Administered 2014-05-08: 10 mg via INTRAVENOUS
  Administered 2014-05-08: 20 mg via INTRAVENOUS
  Administered 2014-05-08: 40 mg via INTRAVENOUS
  Administered 2014-05-08: 10 mg via INTRAVENOUS

## 2014-05-08 MED ORDER — ROCURONIUM BROMIDE 100 MG/10ML IV SOLN
INTRAVENOUS | Status: AC
  Filled 2014-05-08: qty 1

## 2014-05-08 MED ORDER — FENTANYL CITRATE 0.05 MG/ML IJ SOLN
INTRAMUSCULAR | Status: DC | PRN
  Administered 2014-05-08: 50 ug via INTRAVENOUS
  Administered 2014-05-08: 100 ug via INTRAVENOUS
  Administered 2014-05-08: 50 ug via INTRAVENOUS
  Administered 2014-05-08 (×3): 100 ug via INTRAVENOUS

## 2014-05-08 MED ORDER — IBUPROFEN 800 MG PO TABS
800.0000 mg | ORAL_TABLET | Freq: Four times a day (QID) | ORAL | Status: DC | PRN
  Administered 2014-05-09: 800 mg via ORAL
  Filled 2014-05-08: qty 1

## 2014-05-08 MED ORDER — MEPERIDINE HCL 25 MG/ML IJ SOLN: 6.2500 mg | INTRAMUSCULAR | Status: DC | PRN

## 2014-05-08 MED ORDER — MIDAZOLAM HCL 2 MG/2ML IJ SOLN
INTRAMUSCULAR | Status: DC | PRN
  Administered 2014-05-08: 1.5 mg via INTRAVENOUS
  Administered 2014-05-08: .5 mg via INTRAVENOUS

## 2014-05-08 MED ORDER — SCOPOLAMINE 1 MG/3DAYS TD PT72
1.0000 | MEDICATED_PATCH | Freq: Once | TRANSDERMAL | Status: AC
  Administered 2014-05-08: 1 via TRANSDERMAL
  Administered 2014-05-08: 1.5 mg via TRANSDERMAL

## 2014-05-08 MED ORDER — SCOPOLAMINE 1 MG/3DAYS TD PT72
MEDICATED_PATCH | TRANSDERMAL | Status: AC
  Administered 2014-05-08: 1.5 mg via TRANSDERMAL
  Filled 2014-05-08: qty 1

## 2014-05-08 MED ORDER — FENTANYL CITRATE 0.05 MG/ML IJ SOLN
INTRAMUSCULAR | Status: AC
  Filled 2014-05-08: qty 5

## 2014-05-08 MED ORDER — NALOXONE HCL 0.4 MG/ML IJ SOLN: 0.4000 mg | INTRAMUSCULAR | Status: DC | PRN

## 2014-05-08 MED ORDER — 0.9 % SODIUM CHLORIDE (POUR BTL) OPTIME
TOPICAL | Status: DC | PRN
  Administered 2014-05-08 (×4): 1000 mL

## 2014-05-08 MED ORDER — METOCLOPRAMIDE HCL 10 MG PO TABS: 10.0000 mg | ORAL_TABLET | Freq: Three times a day (TID) | ORAL | Status: DC

## 2014-05-08 MED ORDER — DEXAMETHASONE SODIUM PHOSPHATE 10 MG/ML IJ SOLN
INTRAMUSCULAR | Status: AC
  Filled 2014-05-08: qty 1

## 2014-05-08 MED ORDER — BUPIVACAINE LIPOSOME 1.3 % IJ SUSP
20.0000 mL | Freq: Once | INTRAMUSCULAR | Status: AC
  Administered 2014-05-08: 19 mL
  Filled 2014-05-08: qty 20

## 2014-05-08 MED ORDER — HYDROMORPHONE HCL 1 MG/ML IJ SOLN
INTRAMUSCULAR | Status: AC
  Filled 2014-05-08: qty 1

## 2014-05-08 MED ORDER — BUPIVACAINE HCL (PF) 0.25 % IJ SOLN
INTRAMUSCULAR | Status: AC
  Filled 2014-05-08: qty 30

## 2014-05-08 MED ORDER — DIPHENHYDRAMINE HCL 50 MG/ML IJ SOLN: 12.5000 mg | Freq: Four times a day (QID) | INTRAMUSCULAR | Status: DC | PRN

## 2014-05-08 MED ORDER — LACTATED RINGERS IV SOLN
INTRAVENOUS | Status: DC
  Administered 2014-05-08 – 2014-05-09 (×3): via INTRAVENOUS

## 2014-05-08 MED ORDER — HYDROMORPHONE HCL 1 MG/ML IJ SOLN
0.2500 mg | INTRAMUSCULAR | Status: DC | PRN
  Administered 2014-05-08 (×2): 0.5 mg via INTRAVENOUS

## 2014-05-08 MED ORDER — FENTANYL 10 MCG/ML IV SOLN
INTRAVENOUS | Status: DC
  Administered 2014-05-08: 12:00:00 via INTRAVENOUS
  Administered 2014-05-08: 100 ug via INTRAVENOUS
  Administered 2014-05-08: 110 ug via INTRAVENOUS
  Administered 2014-05-08: 150 ug via INTRAVENOUS
  Administered 2014-05-09: via INTRAVENOUS
  Administered 2014-05-09: 170 ug via INTRAVENOUS
  Administered 2014-05-09: 200 ug via INTRAVENOUS
  Administered 2014-05-09: 180 ug via INTRAVENOUS
  Filled 2014-05-08 (×2): qty 50

## 2014-05-08 MED ORDER — GLYCOPYRROLATE 0.2 MG/ML IJ SOLN
INTRAMUSCULAR | Status: DC | PRN
  Administered 2014-05-08: 0.4 mg via INTRAVENOUS
  Administered 2014-05-08 (×2): 0.1 mg via INTRAVENOUS

## 2014-05-08 MED ORDER — PROPOFOL 10 MG/ML IV BOLUS
INTRAVENOUS | Status: DC | PRN
  Administered 2014-05-08: 200 mg via INTRAVENOUS

## 2014-05-08 MED ORDER — GLYCOPYRROLATE 0.2 MG/ML IJ SOLN
INTRAMUSCULAR | Status: AC
  Filled 2014-05-08: qty 3

## 2014-05-08 MED ORDER — TRAMADOL HCL 50 MG PO TABS: 50.0000 mg | ORAL_TABLET | Freq: Four times a day (QID) | ORAL | Status: DC | PRN

## 2014-05-08 MED ORDER — PROMETHAZINE HCL 25 MG/ML IJ SOLN
6.2500 mg | INTRAMUSCULAR | Status: DC | PRN
Start: 2014-05-08 — End: 2014-05-08

## 2014-05-08 MED ORDER — NEOSTIGMINE METHYLSULFATE 10 MG/10ML IV SOLN
INTRAVENOUS | Status: DC | PRN
  Administered 2014-05-08: 4 mg via INTRAVENOUS

## 2014-05-08 MED ORDER — LIDOCAINE HCL (CARDIAC) 20 MG/ML IV SOLN
INTRAVENOUS | Status: DC | PRN
  Administered 2014-05-08: 80 mg via INTRAVENOUS

## 2014-05-08 MED ORDER — DEXAMETHASONE SODIUM PHOSPHATE 10 MG/ML IJ SOLN
INTRAMUSCULAR | Status: DC | PRN
  Administered 2014-05-08: 10 mg via INTRAVENOUS

## 2014-05-08 MED ORDER — HYDROMORPHONE HCL 1 MG/ML IJ SOLN
INTRAMUSCULAR | Status: DC | PRN
  Administered 2014-05-08: 2 mg via INTRAVENOUS

## 2014-05-08 MED ORDER — LACTATED RINGERS IV SOLN
INTRAVENOUS | Status: DC
  Administered 2014-05-08 (×3): via INTRAVENOUS

## 2014-05-08 MED ORDER — ONDANSETRON HCL 4 MG/2ML IJ SOLN
INTRAMUSCULAR | Status: DC | PRN
  Administered 2014-05-08 (×2): 2 mg via INTRAVENOUS

## 2014-05-08 MED ORDER — OXYCODONE-ACETAMINOPHEN 5-325 MG PO TABS
1.0000 | ORAL_TABLET | Freq: Four times a day (QID) | ORAL | Status: DC | PRN
  Administered 2014-05-09: 1 via ORAL
  Administered 2014-05-09: 2 via ORAL
  Administered 2014-05-09: 1 via ORAL
  Filled 2014-05-08 (×2): qty 1
  Filled 2014-05-08: qty 2

## 2014-05-08 MED ORDER — DIPHENHYDRAMINE HCL 12.5 MG/5ML PO ELIX: 12.5000 mg | ORAL_SOLUTION | Freq: Four times a day (QID) | ORAL | Status: DC | PRN

## 2014-05-08 MED ORDER — KETOROLAC TROMETHAMINE 30 MG/ML IJ SOLN
INTRAMUSCULAR | Status: DC | PRN
  Administered 2014-05-08: 30 mg via INTRAVENOUS

## 2014-05-08 MED ORDER — MIDAZOLAM HCL 2 MG/2ML IJ SOLN
INTRAMUSCULAR | Status: AC
  Filled 2014-05-08: qty 2

## 2014-05-08 MED ORDER — SODIUM CHLORIDE 0.9 % IJ SOLN: 9.0000 mL | INTRAMUSCULAR | Status: DC | PRN

## 2014-05-08 MED ORDER — NEOSTIGMINE METHYLSULFATE 10 MG/10ML IV SOLN
INTRAVENOUS | Status: AC
  Filled 2014-05-08: qty 1

## 2014-05-08 MED ORDER — MENTHOL 3 MG MT LOZG
1.0000 | LOZENGE | OROMUCOSAL | Status: DC | PRN
  Administered 2014-05-08: 3 mg via ORAL
  Filled 2014-05-08: qty 9

## 2014-05-08 MED ORDER — ONDANSETRON HCL 4 MG/2ML IJ SOLN: 4.0000 mg | Freq: Four times a day (QID) | INTRAMUSCULAR | Status: DC | PRN

## 2014-05-08 SURGICAL SUPPLY — 63 items
BARRIER ADHS 3X4 INTERCEED (GAUZE/BANDAGES/DRESSINGS) IMPLANT
BRR ADH 4X3 ABS CNTRL BYND (GAUZE/BANDAGES/DRESSINGS)
CANISTER SUCT 3000ML (MISCELLANEOUS) ×5 IMPLANT
CLIP TI WIDE RED SMALL 6 (CLIP) ×3 IMPLANT
CLOSURE WOUND 1/2 X4 (GAUZE/BANDAGES/DRESSINGS)
CLOTH BEACON ORANGE TIMEOUT ST (SAFETY) ×5 IMPLANT
COVER LIGHT HANDLE  1/PK (MISCELLANEOUS) ×2
COVER LIGHT HANDLE 1/PK (MISCELLANEOUS) ×1 IMPLANT
DECANTER SPIKE VIAL GLASS SM (MISCELLANEOUS) IMPLANT
DRAPE CESAREAN BIRTH W POUCH (DRAPES) ×5 IMPLANT
DRAPE WARM FLUID 44X44 (DRAPE) IMPLANT
DRSG OPSITE POSTOP 4X10 (GAUZE/BANDAGES/DRESSINGS) ×5 IMPLANT
DRSG TEGADERM 2.38X2.75 (GAUZE/BANDAGES/DRESSINGS) IMPLANT
DRSG XEROFORM 1X8 (GAUZE/BANDAGES/DRESSINGS) ×5 IMPLANT
DURAPREP 26ML APPLICATOR (WOUND CARE) ×5 IMPLANT
ELECT BLADE 6.5 EXT (BLADE) ×3 IMPLANT
GAUZE SPONGE 4X4 16PLY XRAY LF (GAUZE/BANDAGES/DRESSINGS) ×3 IMPLANT
GLOVE BIO SURGEON STRL SZ8 (GLOVE) ×3 IMPLANT
GLOVE BIOGEL PI IND STRL 6.5 (GLOVE) ×1 IMPLANT
GLOVE BIOGEL PI IND STRL 7.0 (GLOVE) ×9 IMPLANT
GLOVE BIOGEL PI IND STRL 8 (GLOVE) ×4 IMPLANT
GLOVE BIOGEL PI INDICATOR 6.5 (GLOVE) ×2
GLOVE BIOGEL PI INDICATOR 7.0 (GLOVE) ×10
GLOVE BIOGEL PI INDICATOR 8 (GLOVE) ×4
GLOVE ECLIPSE 6.5 STRL STRAW (GLOVE) ×3 IMPLANT
GLOVE ECLIPSE 7.5 STRL STRAW (GLOVE) ×10 IMPLANT
GLOVE SURG SS PI 7.0 STRL IVOR (GLOVE) ×3 IMPLANT
GOWN STRL REUS W/TWL LRG LVL3 (GOWN DISPOSABLE) ×15 IMPLANT
HEMOSTAT SURGICEL 4X8 (HEMOSTASIS) ×3 IMPLANT
MARKER SKIN DUAL TIP RULER LAB (MISCELLANEOUS) ×3 IMPLANT
NDL HYPO 25X1 1.5 SAFETY (NEEDLE) IMPLANT
NEEDLE HYPO 25X1 1.5 SAFETY (NEEDLE) ×5 IMPLANT
NS IRRIG 1000ML POUR BTL (IV SOLUTION) ×14 IMPLANT
PACK ABDOMINAL GYN (CUSTOM PROCEDURE TRAY) ×5 IMPLANT
PAD ABD 7.5X8 STRL (GAUZE/BANDAGES/DRESSINGS) ×3 IMPLANT
PAD OB MATERNITY 4.3X12.25 (PERSONAL CARE ITEMS) ×5 IMPLANT
PROTECTOR NERVE ULNAR (MISCELLANEOUS) ×5 IMPLANT
RETAINER VISCERAL (MISCELLANEOUS) ×3 IMPLANT
RETRACTOR WND ALEXIS 25 LRG (MISCELLANEOUS) IMPLANT
RTRCTR WOUND ALEXIS 25CM LRG (MISCELLANEOUS)
SPONGE GAUZE 4X4 12PLY STER LF (GAUZE/BANDAGES/DRESSINGS) ×10 IMPLANT
SPONGE LAP 18X18 X RAY DECT (DISPOSABLE) ×19 IMPLANT
STAPLER VISISTAT 35W (STAPLE) ×5 IMPLANT
STRIP CLOSURE SKIN 1/2X4 (GAUZE/BANDAGES/DRESSINGS) IMPLANT
SUT CHROMIC 3 0 SH 27 (SUTURE) IMPLANT
SUT VIC AB 0 CT1 18XCR BRD8 (SUTURE) ×8 IMPLANT
SUT VIC AB 0 CT1 27 (SUTURE) ×10
SUT VIC AB 0 CT1 27XBRD ANBCTR (SUTURE) ×2 IMPLANT
SUT VIC AB 0 CT1 36 (SUTURE) IMPLANT
SUT VIC AB 0 CT1 8-18 (SUTURE) ×10
SUT VIC AB 1 CT1 18XBRD ANBCTR (SUTURE) IMPLANT
SUT VIC AB 1 CT1 8-18 (SUTURE)
SUT VIC AB 3-0 CT1 27 (SUTURE) ×5
SUT VIC AB 3-0 CT1 TAPERPNT 27 (SUTURE) ×5 IMPLANT
SUT VIC AB 3-0 SH 27 (SUTURE) ×5
SUT VIC AB 3-0 SH 27X BRD (SUTURE) ×3 IMPLANT
SUT VICRYL 0 TIES 12 18 (SUTURE) ×8 IMPLANT
SUT VICRYL 3 0 BR 18  UND (SUTURE)
SUT VICRYL 3 0 BR 18 UND (SUTURE) IMPLANT
SYR CONTROL 10ML LL (SYRINGE) ×3 IMPLANT
TOWEL OR 17X24 6PK STRL BLUE (TOWEL DISPOSABLE) ×13 IMPLANT
TRAY FOLEY CATH 14FR (SET/KITS/TRAYS/PACK) ×5 IMPLANT
WATER STERILE IRR 1000ML POUR (IV SOLUTION) ×2 IMPLANT

## 2014-05-08 NOTE — Addendum Note (Signed)
Addendum  created 05/08/14 1707 by Asher Muir, CRNA   Modules edited: Notes Section   Notes Section:  File: [id number]

## 2014-05-08 NOTE — Anesthesia Postprocedure Evaluation (Signed)
  Anesthesia Post-op Note  Patient: Penny Stanley  Procedure(s) Performed: Procedure(s): SUPRACERVICAL HYSTERECTOMY ABDOMINAL    (N/A) BILATERAL SALPINGO OOPHORECTOMY  (Bilateral) SIGMOID MOBILIZATION FROM UTERUS  (N/A) PELVIC ADHESIOLYSIS WITH GENERAL SEURGEY CONSULTATION  (N/A)  Patient Location: PACU  Anesthesia Type:General  Level of Consciousness: awake, alert  and oriented  Airway and Oxygen Therapy: Patient Spontanous Breathing  Post-op Pain: mild, moderate  Post-op Assessment: Post-op Vital signs reviewed, Patient's Cardiovascular Status Stable, Respiratory Function Stable, Patent Airway, No signs of Nausea or vomiting and Pain level controlled  Post-op Vital Signs: Reviewed and stable  Last Vitals:  Filed Vitals:   05/08/14 1115  BP: 117/61  Pulse: 68  Temp:   Resp: 13    Complications: No apparent anesthesia complications

## 2014-05-08 NOTE — Anesthesia Postprocedure Evaluation (Signed)
Anesthesia Post Note  Patient: Penny Stanley  Procedure(s) Performed: Procedure(s) (LRB): SUPRACERVICAL HYSTERECTOMY ABDOMINAL    (N/A) BILATERAL SALPINGO OOPHORECTOMY  (Bilateral) SIGMOID MOBILIZATION FROM UTERUS  (N/A) PELVIC ADHESIOLYSIS WITH GENERAL SEURGEY CONSULTATION  (N/A)  Anesthesia type: General  Patient location: [facility]  Post pain: Pain level controlled  Post assessment: Post-op Vital signs reviewed  Last Vitals:  Filed Vitals:   05/08/14 1448  BP:   Pulse:   Temp:   Resp: 16    Post vital signs: Reviewed  Level of consciousness: sedated  Complications: No apparent anesthesia complications

## 2014-05-08 NOTE — Interval H&P Note (Signed)
History and Physical Interval Note:  05/08/2014 7:08 AM  Penny Stanley  has presented today for surgery, with the diagnosis of bilateral endometriomas, dysmenorrhea, menorrhagia, fibroid uterus  The various methods of treatment have been discussed with the patient and family. After consideration of risks, benefits and other options for treatment, the patient has consented to  Procedure(s): HYSTERECTOMY ABDOMINAL (N/A) BILATERAL SALPINGECTOMY (Bilateral) RIGHT OVARIAN CYSTECTOMY (Right) POSSIBLE RIGHT OOPHORECTOMY (Right) as a surgical intervention .  The patient's history has been reviewed, patient examined, no change in status, stable for surgery.  I have reviewed the patient's chart and labs.  Questions were answered to the patient's satisfaction.    Patient fully aware that after this surgery she will not be able to have children in the future.  Terrance Mass

## 2014-05-08 NOTE — H&P (View-Only) (Signed)
Penny Stanley is an 34 y.o. female. For her preoperative consultation for her upcoming surgery. Her history is as follows:  Patient was seen in 03/06/2014. She is a gravida 1 para 1 with long-standing history of dysmenorrhea and menorrhagia and has presented to the emergency room several times this year as well as having been treated as an inpatient and outpatient for bilateral tubo-ovarian abscess. Patient recently had been given at the GYN clinic at [facility] hospital Lupron 11.25 mg in August of this year which has relieved her symptoms but she is dealing with issues of hot flashes. Patient has had several ultrasounds as well as CT and MRIs. (Patient with past hospitalization for intravenous antibiotic as well as patient antibiotic treatment) (  09/17/2013 CT of abdomen and pelvis: IMPRESSION: 1. Bilateral cystic masses are identified within the adnexal regions. If there are signs and symptoms of infection tubo-ovarian abscess cannot be excluded. Recommend further evaluation with pelvic sonogram. 2. The appendix is difficult to visualize separate from the right lower quadrant bowel wall loops.   09/17/2013 pelvic ultrasound: IMPRESSION: 1. Suspect pelvic inflammatory disease by CT. There are 2 mildly complex right adnexal cysts, up to 3 cm diameter, which could reflect tuboovarian abscess. 2. 4 cm left ovarian cyst which appears simple, favoring follicular cyst rather than abscess.  09/22/2013 pelvic ultrasound: IMPRESSION: 1. Mild interval enlargement of a complex cyst in the right ovary, now 5 cm in diameter. The appearance is nonspecific and could reflect a hemorrhagic cyst or, if there is clinical pelvic inflammatory disease, a tubo-ovarian complex/abscess. Note that hydrosalpinx is not visible. Regardless of therapy, followup imaging recommended to document normalization. 2. 4 cm left ovarian cyst which appears simple, favoring incidental follicular cyst. 3. 3 cm uterine  fibroid.  Ultrasound 10/23/2013: IMPRESSION: Slight decrease in size of complex right adnexal cystic lesion. Again, primary differential considerations include tubo-ovarian abscess although involuting hemorrhagic cyst may appear similar. Endometrioma is less likely.  Decrease in size of dominant left ovarian cyst likely representing involuting physiologic cyst.  Globular, poorly defined endometrial/myometrial interface which may suggest adenomyosis. This could be confirmed with MRI performed on a nonemergent basis as an outpatient.  MRI of pelvis August 2015: IMPRESSION: Bilateral endometriomas, measuring 5.6 cm on the right and 4.7 cm on the left.  Second 5.3 cm indeterminate but probably benign cystic lesion in the anterior right adnexal region. Differential diagnosis includes peritoneal occlusion cyst, hydrosalpinx, and cystic ovarian neoplasm. If not evaluated surgically, continued imaging followup by MRI is recommended in 3 months.  3.2 cm right fundal uterine fibroid. No definite MR signs of uterine Adenomyosis.  Patient with no acute distress today.  Exam: Abdomen: Soft mildly tender right lower quadrant but no rebound or guarding Pelvic: Bartholin urethra Skene was within normal limits Vagina: No lesions or discharge Uterus: Anteverted slightly irregular at the fundal region nontender Adnexa: No discernible palpable masses or tenderness although patient slightly overweight but no adnexal tenderness Rectal exam: Not done  A pelvic ultrasound was ordered in our office today to compare with previous study of 10/23/2013. Patient been treated as inpatient and outpatient with antibiotics several months ago and to see the response as well as from the Lupron.  Ultrasound December 15: Uterus measured 110.9 x 6.5 x 5.2 cm. The meter stripe 1.8 mm. Patient's uterus severely retroverted needed difficult to identify the endometrium. Patient had several intramural myomas the  largest one measuring 20 x 22 mm. The right ovary had a thin-walled cystic mass  with diffuse low level echoes homogeneous in appearance suspicious for endometrioma with a measurement of 5.2 x 4.7 x 5.4 cm. Left ovary thinwall cyst diffuse low level homogeneous echoes possibly a smaller endometrioma measuring 2.5 x 1.0 x 2.6 and meters negative color flow. Both ovaries had arterial blood flow was noted. Questionable left cul-de-sac mass measuring 2.5 cm.  Since she was wearing off the effects of the Depo-Lupron in December she was given a shot of Depo-Provera 150 mg IM. She is currently taking all TRAM 50 mg every 6 hours for her pain.   Patient was seen in the office on December 15. She is  Pertinent Gynecological History: Menses: Currently no menses since she took Lupron and now Depo-Provera Bleeding: None Contraception: condoms DES exposure: unknown Blood transfusions: none Sexually transmitted diseases: no past history Previous GYN Procedures: One vaginal delivery  Last mammogram: Not indicated Date: Not indicated Last pap: normal Date: 40 OB History: G 1, P 1   Menstrual History: Menarche age: 19 Patient's last menstrual period was 04/09/2013.    Past Medical History  Diagnosis Date  . Obesity   . Shortness of breath dyspnea     currently with exertion, ie climbing stairs- per pt this is recent development due to pain    Past Surgical History  Procedure Laterality Date  . No past surgeries      Family History  Problem Relation Age of Onset  . Diabetes Maternal Grandmother   . Hypertension Maternal Grandmother   . Hypertension Maternal Grandfather   . Diabetes Paternal Grandmother   . Hypertension Paternal Grandmother   . Hypertension Paternal Grandfather     Social History:  reports that she has quit smoking. She quit smokeless tobacco use about 6 years ago. She reports that she does not drink alcohol or use illicit drugs.  Allergies: No Known Allergies   (Not  in a hospital admission)  REVIEW OF SYSTEMS: A ROS was performed and pertinent positives and negatives are included in the history.  GENERAL: No fevers or chills. HEENT: No change in vision, no earache, sore throat or sinus congestion. NECK: No pain or stiffness. CARDIOVASCULAR: No chest pain or pressure. No palpitations. PULMONARY: No shortness of breath, cough or wheeze. GASTROINTESTINAL: No abdominal pain, nausea, vomiting or diarrhea, melena or bright red blood per rectum. GENITOURINARY: No urinary frequency, urgency, hesitancy or dysuria. MUSCULOSKELETAL: No joint or muscle pain, no back pain, no recent trauma. DERMATOLOGIC: No rash, no itching, no lesions. ENDOCRINE: No polyuria, polydipsia, no heat or cold intolerance. No recent change in weight. HEMATOLOGICAL: No anemia or easy bruising or bleeding. NEUROLOGIC: No headache, seizures, numbness, tingling or weakness. PSYCHIATRIC: No depression, no loss of interest in normal activity or change in sleep pattern.     Blood pressure 128/76, last menstrual period 04/09/2013.  Physical Exam:  HEENT:unremarkable Neck:Supple, midline, no thyroid megaly, no carotid bruits Lungs:  Clear to auscultation no rhonchi's or wheezes Heart:Regular rate and rhythm, no murmurs or gallops Breast Exam: Both breasts were examined sitting supine position there were symmetrical in appearance no skin discoloration or nipple inversion no palpable mass or tenderness no supra clavicular or axillary lymphadenopathy Abdomen: Soft tender lower abdomen no rebound or guarding Pelvic:BUS within normal limits Vagina: No lesions or discharge Cervix: No lesions or discharge Uterus: Firm non-mobile slightly anteverted tender at the area of the cul-de-sac Adnexa: Tenderness noted bilateral no rebound or guarding Extremities: No cords, no edema Rectal: Nontender no masses or lesion on rectal digital  exam   Assessment/plan:: Patient with apparent bilateral endometriomas and  fibroid uterus. Patient treated earlier this year for suspected bilateral tubo-ovarian abscesses. Patient suffers from dysmenorrhea and menorrhagia. Patient will be scheduled to undergo an abdominal hysterectomy with right cystectomy possible right salpingo-oophorectomy along with left cystectomy and left salpingectomy. Patient was counseled of the possibility in the event of severe endometriosis that she may lose both ovaries which may indicate that she would need hormone replacement therapy postop for several years to help with potential vasomotor symptoms and for bone health. We are going to place her on a bowel prep the day before surgery. Will have general surgeon on standby as well in the event that there may be involvement with her intestine and/or her appendix may need to be removed. Additional risk of the surgery as follows:                        Patient was counseled as to the risk of surgery to include the following:  1. Infection (prohylactic antibiotics will be administered)  2. DVT/Pulmonary Embolism (prophylactic pneumo compression stockings will be used)  3.Trauma to internal organs requiring additional surgical procedure to repair any injury to     Internal organs requiring perhaps additional hospitalization days.  4.Hemmorhage requiring transfusion and blood products which carry risks such as             anaphylactic reaction, hepatitis and AIDS  Patient had received literature information on the procedure scheduled and all her questions were answered and fully accepts all risk.   [facility] HMD1:58 PMTD@Note :     Terrance Mass 04/30/2014, 1:42 PM  Note: This dictation was prepared with  Dragon/digital dictation along withSmart phrase technology. Any transcriptional errors that result from this process are unintentional.

## 2014-05-08 NOTE — Op Note (Signed)
05/08/2014 Intra-operative consultation 9:48 AM  PATIENT:  Penny Stanley  34 y.o. female  PRE-OPERATIVE DIAGNOSIS:  bilateral endometriomas, dysmenorrhea, menorrhagia, fibroid uterus, significant adhesions to sigmoid colon  POST-OPERATIVE DIAGNOSIS:  bilateral endometriomas, dysmenorrhea, menorrhagia, fibroid uterus, significant adhesions to sigmoid colon  PROCEDURE:  Procedure(s): Lysis of adhesions between uterus and sigmoid colon 30 minutes  SURGEON: Georganna Skeans, MD  ASSISTANTS: Uvaldo Rising, MD. Abbie Sons, MD  ANESTHESIA:   general  EBL:  Total I/O In: 2000 [I.V.:2000] Out: 750 [Urine:50; Blood:700]  BLOOD ADMINISTERED:none  DRAINS: none   SPECIMEN:  No Specimen  DISPOSITION OF SPECIMEN:  N/A  COUNTS:  NO pending when I scrubbed out  DICTATION: .Dragon Dictation I was asked to do an intraoperative consultation by Dr. Toney Rakes regarding significant adhesions between the sigmoid colon & the uterus. This patient has a history of stage IV endometriosis. On my arrival, Dr. Jerilee Hoh had already done an excellent dissection of both ureters. The sigmoid colon was adherent to the uterus posteriorly. There was evidence of ongoing chronic inflammation from the endometriosis. I carefully dissected the sigmoid colon with blunt and sharp dissection off of the uterus, extending down to the cervical region. We were careful on both sides of the ureters, continuously identifying them during our dissection. No enterotomies were made in the colon. The colon appeared viable but with some evidence of chronic adherent inflammation. At this point, Dr. Toney Rakes and Dr. Phineas Real had adequate exposure to complete the supracervical hysterectomy. I stayed for this portion. Once that was completed, I reinspected the sigmoid colon and it remained intact there was no evidence of any bleeding or other complicating features. At this point I scrubbed out and the patient remained in the operating room  and Dr. Toney Rakes. We will continue to follow this patient closely in the postoperative period.  PATIENT DISPOSITION:  Remains in OR with Dr. Aldona Lento, MD, MPH, [place] Pager: [phone]  2/16/20169:48 AM

## 2014-05-08 NOTE — Op Note (Signed)
Operative Note  05/08/2014  10:54 AM  PATIENT:  Penny Stanley  34 y.o. female  PRE-OPERATIVE DIAGNOSIS:  bilateral endometriomas, dysmenorrhea, menorrhagia, fibroid uterus  POST-OPERATIVE DIAGNOSIS:  bilateral endometriomas, dysmenorrhea, menorrhagia, fibroid uterus  PROCEDURE:  Procedure(s): SUPRACERVICAL HYSTERECTOMY ABDOMINAL    BILATERAL SALPINGO OOPHORECTOMY  SIGMOID MOBILIZATION FROM UTERUS  PELVIC ADHESIOLYSIS WITH GENERAL SEURGEY CONSULTATION   SURGEON:  Surgeon(s): Terrance Mass, MD Anastasio Auerbach, MD Georganna Skeans, MD  ANESTHESIA:   general  FINDINGS:Bilateral endometriomas, extensive pelvic adhesions, obliterated culde sac  Stage IV Endometriosis   DESCRIPTION OF OPERATION:The patient was taken to the operating room and was placed in a dorsal supine position. She was then placed under general anesthesia and intubated. A time out was undertaken to correctly identify the patient and procedure to be undertaken. The procedure was begun by performing a Pfannenstiel skin Incision with the first knife. The incision was extended to the level of the fascia using Bovie coagulation. The fascia was scored bilaterally at the midline. The fascial incision was then extended in a curvilinear fashion using the Mayo scissors. The fascia was dissected from the muscular area below by both sharp and blunt dissection. The muscular area was dissected along the midline by both sharp and blunt dissection. The peritoneum was subsequently entered. The incision was then extended in a vertical fashion using Metzenbaum scissors. Immediate notation was made of extensive endometriosis with obliterated cul-de-sac large right endometrioma left pelvic sidewall encased in adhesions with scattered endometriosis. The patient was then placed in Trendelenburg position and O'Connor-O'Sullivan retractor was placed. The right round ligament was identified and transected and with the Metzenbaum scissors the  visceral peritoneum was incised to the area anterior to the uterus near the lower uterine segment. Extensive pelvic adhesion was required to free the right ovarian endometrioma from the pelvic sidewall. Careful attention was placed on identifying the right ureter. The infundibulopelvic ligament was clamped 2 and incised and free tied with 0 Vicryl suture followed by transfixation stitch stand clear away from the right ureter. It was noted that the cul-de-sac was obliterated and Dr. Georganna Skeans general surgeon was consulted to assist in freeing the sigmoid colon from the posterior uterus to complete the hysterectomy. Please see separate dictation for his portion. We then proceeded to identify the left ureter. The left round ligament was transected as well the left infundibulopelvic ligament was clamped cut and suture ligated with 0 Vicryl suture followed by transfixation stitch was 0 Vicryl suture. Meticulous dissection was also required to free the adnexal mass on the pelvic sidewalls. Clear from the ureter. It was decided to proceed with a supracervical hysterectomy because of the obliterated cul-de-sac and extensive endometriosis. The vesical peritoneum on patient's left side was freed to the level of the lower uterine segment and on each side of the uterus the uterine arteries were clamped cut and suture ligated with 0 Vicryl suture. With a scalpel the uterus was amputated from the cervix and passed off the operative field consisting of uterus and right tube and ovary an apparent left ovary and tube. The cervical canal was cauterized with the Bovie. The pelvic cavity was clear was irrigated with normal saline solution. The individual bleeders were cauterized. For additional postoperative hemostasis and Surgicel was placed on the raw surfaces. The sponge count needle count were correct. The O'Connor-O'Sullivan retractor was removed. Expareol was infiltrated through all the tissue layers upon closure for  postoperative analgesia. A total 30 cc was used. The visceral  peritoneum was not reapproximated. The rectus fascia was closed with a running stitch of 0 Vicryl suture. The subcutaneous tissue was reapproximated 3-0 Vicryl suture. The skin was reapproximated with staples. Pressure dressing was placed. Patient was extubated transferred to recovery room stable vital signs. Blood loss was 750 cc. Urine output 100 cc and clear.   ESTIMATED BLOOD LOSS:  750 cc's   Intake/Output Summary (Last 24 hours) at 05/08/14 1054 Last data filed at 05/08/14 1007  Gross per 24 hour  Intake   2600 ml  Output    850 ml  Net   1750 ml     BLOOD ADMINISTERED:none   LOCAL MEDICATIONS USED:  OTHER Exparel  30 cc's throughout tissue layers upon closing.   SPECIMEN:  Source of Specimen:  Uterus, and bilateral  tubes and ovaries  DISPOSITION OF SPECIMEN:  PATHOLOGY  COUNTS:  YES  PLAN OF CARE: Transfer to PACU  [facility] HMD10:54 AMTD@

## 2014-05-08 NOTE — Anesthesia Preprocedure Evaluation (Addendum)
Anesthesia Evaluation  Patient identified by MRN, date of birth, ID band Patient awake    Reviewed: Allergy & Precautions, H&P , NPO status , Patient's Chart, lab work & pertinent test results  Airway Mallampati: II  TM Distance: >3 FB Neck ROM: full    Dental no notable dental hx. (+) Teeth Intact   Pulmonary former smoker,    Pulmonary exam normal       Cardiovascular negative cardio ROS      Neuro/Psych negative neurological ROS  negative psych ROS   GI/Hepatic negative GI ROS, Neg liver ROS,   Endo/Other  negative endocrine ROS  Renal/GU negative Renal ROS     Musculoskeletal   Abdominal Normal abdominal exam  (+)   Peds  Hematology negative hematology ROS (+)   Anesthesia Other Findings   Reproductive/Obstetrics negative OB ROS                            Anesthesia Physical Anesthesia Plan  ASA: II  Anesthesia Plan: General   Post-op Pain Management:    Induction: Intravenous  Airway Management Planned: Oral ETT  Additional Equipment:   Intra-op Plan:   Post-operative Plan: Extubation in OR  Informed Consent: I have reviewed the patients History and Physical, chart, labs and discussed the procedure including the risks, benefits and alternatives for the proposed anesthesia with the patient or authorized representative who has indicated his/her understanding and acceptance.   Dental Advisory Given and History available from chart only  Plan Discussed with: CRNA, Surgeon and Anesthesiologist  Anesthesia Plan Comments:        Anesthesia Quick Evaluation

## 2014-05-08 NOTE — Transfer of Care (Signed)
Immediate Anesthesia Transfer of Care Note  Patient: Penny Stanley  Procedure(s) Performed: Procedure(s): SUPRACERVICAL HYSTERECTOMY ABDOMINAL    (N/A) BILATERAL SALPINGO OOPHORECTOMY  (Bilateral) SIGMOID MOBILIZATION FROM UTERUS  (N/A) PELVIC ADHESIOLYSIS WITH GENERAL SEURGEY CONSULTATION  (N/A)  Patient Location: PACU  Anesthesia Type:General  Level of Consciousness: awake, alert  and oriented  Airway & Oxygen Therapy: Patient Spontanous Breathing and Patient connected to nasal cannula oxygen  Post-op Assessment: Report given to RN, Post -op Vital signs reviewed and stable and Patient moving all extremities X 4  Post vital signs: Reviewed and stable  Last Vitals:  Filed Vitals:   05/08/14 0611  BP: 129/79  Pulse: 97  Temp: 36.9 C  Resp: 18    Complications: No apparent anesthesia complications

## 2014-05-09 ENCOUNTER — Encounter (HOSPITAL_COMMUNITY): Payer: Self-pay | Admitting: Gynecology

## 2014-05-09 NOTE — Progress Notes (Signed)
1 Day Post-Op  Subjective: She looks fine, rather sore, on PCA, she is ready to get off that.  She moves and hurts.  Dressing is large and intact.  Objective: Vital signs in last 24 hours: Temp:  [97.5 F (36.4 C)-99.6 F (37.6 C)] 99.6 F (37.6 C) (02/17 0541) Pulse Rate:  [67-93] 73 (02/17 0541) Resp:  [9-24] 17 (02/17 0549) BP: (109-126)/(49-77) 120/63 mmHg (02/17 0541) SpO2:  [96 %-100 %] 97 % (02/17 0549) Weight:  [98.431 kg (217 lb)] 98.431 kg (217 lb) (02/16 1143) Last BM Date: 05/07/14 PO 1330  Clears Afebrile, VSS Wbc Up some Glucose is up Intake/Output from previous day: 02/16 0701 - 02/17 0700 In: 6748.9 [P.O.:1330; I.V.:5418.9] Out: 2423 [Urine:2725; Blood:750] Intake/Output this shift: Total I/O In: 2710.6 [P.O.:850; I.V.:1860.6] Out: 1400 [Urine:1400]  General appearance: alert, cooperative and no distress GI: Large dressing in place, no bowel sounds, sore with movement.  No flatus so far.  Lab Results:   Recent Labs  05/08/14 1050  WBC 13.2*  HGB 10.3*  HCT 32.2*  PLT 178    BMET  Recent Labs  05/08/14 1050  NA 138  K 4.0  CL 112  CO2 24  GLUCOSE 145*  BUN 8  CREATININE 0.73  CALCIUM 8.1*   PT/INR No results for input(s): LABPROT, INR in the last 72 hours.  No results for input(s): AST, ALT, ALKPHOS, BILITOT, PROT, ALBUMIN in the last 168 hours.   Lipase     Component Value Date/Time   LIPASE 22 09/17/2013 1417     Studies/Results: No results found.  Medications: . fentaNYL   Intravenous 6 times per day  . metoCLOPramide  10 mg Oral TID WC   . lactated ringers 125 mL/hr at 05/09/14 0549   Prior to Admission medications   Medication Sig Start Date End Date Taking? Authorizing Provider  ibuprofen (ADVIL,MOTRIN) 800 MG tablet Take 1 tablet (800 mg total) by mouth 3 (three) times daily. Patient taking differently: Take 800 mg by mouth every 8 (eight) hours as needed.  11/13/13  Yes Lavonia Drafts, MD   multivitamin-iron-minerals-folic acid (CENTRUM) chewable tablet Chew 1 tablet by mouth daily.   Yes Historical Provider, MD  naproxen sodium (ANAPROX) 220 MG tablet Take 440 mg by mouth daily as needed.   Yes Historical Provider, MD  metoCLOPramide (REGLAN) 10 MG tablet Take 1 tablet (10 mg total) by mouth 3 (three) times daily with meals. 04/30/14   Terrance Mass, MD  oxyCODONE-acetaminophen (PERCOCET/ROXICET) 5-325 MG per tablet Take 1-2 tablets by mouth every 6 (six) hours as needed for moderate pain or severe pain. 04/30/14   Terrance Mass, MD  peg 3350 powder (MOVIPREP) 100 G SOLR At noon day prior to surgery drink 8 ounces of prepared solution q 15 mins until first liter is completed. It is recommended at this point that you drink 16 ounces of clear liquid. Then in an hour or two begin the 2nd liter and repeat the same way as you did the first liter. 04/30/14   Terrance Mass, MD  traMADol (ULTRAM) 50 MG tablet Take 1 tablet (50 mg total) by mouth every 6 (six) hours as needed. 04/30/14   Terrance Mass, MD   Assessment/Plan 1.  Bilateral endometriomas, dysmenorrhea, menorrhagia, fibroid uterus, significant adhesions to sigmoid colon S/p SUPRACERVICAL HYSTERECTOMY ABDOMINAL, BILATERAL SALPINGO OOPHORECTOMY,  SIGMOID MOBILIZATION FROM UTERUS, PELVIC ADHESIOLYSIS WITH GENERAL SEURGEY CONSULTATION,05/08/2014,  Terrance Mass, MD 2.  Lysis of adhesions between uterus and  sigmoid colon 30 minutes, 05/08/14, Dr. Georganna Skeans.   Plan:  I would keep her on clears. Mobilize and wait for bowel function.  I told her she needed stuff coming thru rectum before advancing PO's. I have added SCD for DVT prophylaxis.    LOS: 1 day    Penny Stanley 05/09/2014  Agree with above. At [facility]  Alphonsa Overall, MD, [facility] Surgery Pager: [phone] Office phone:  [phone]

## 2014-05-09 NOTE — Progress Notes (Signed)
1 Day Post-Op Procedure(s) (LRB): SUPRACERVICAL HYSTERECTOMY ABDOMINAL    (N/A) BILATERAL SALPINGO OOPHORECTOMY  (Bilateral) SIGMOID MOBILIZATION FROM UTERUS  (N/A) PELVIC ADHESIOLYSIS WITH GENERAL SEURGEY CONSULTATION  (N/A)  Subjective: Patient reports tolerating PO.    Objective: I have reviewed patient's vital signs, intake and output, medications and labs.  General: alert and cooperative Resp: clear to auscultation bilaterally Cardio: regular rate and rhythm, S1, S2 normal, no murmur, click, rub or gallop GI: soft, non-tender; bowel sounds normal; no masses,  no organomegaly Extremities: extremities normal, atraumatic, no cyanosis or edema Vaginal Bleeding: none   Dressing removed. Incision intact. Patient had bowel sounds in all 4 abdominal quadrants.  Postop labs: Hemoglobin 10.3, hematocrit 32.2, platelet count 178,000. Electrolytes: Sodium 138, potassium 4.0 chloride 112, BUN 8, creatinine 0.73  Assessment: s/p Procedure(s): SUPRACERVICAL HYSTERECTOMY ABDOMINAL    (N/A) BILATERAL SALPINGO OOPHORECTOMY  (Bilateral) SIGMOID MOBILIZATION FROM UTERUS  (N/A) PELVIC ADHESIOLYSIS WITH GENERAL SEURGEY CONSULTATION  (N/A): stable, progressing well and tolerating diet   Findings from surgery we once again discussed with the patient. We'll wait for the pathology report confirmed that the left ovary had been removed as well due to her extensive endometriosis a supracervical hysterectomy with bilateral salpingo-oophorectomy was performed. Postoperatively when she returns to the office we will check an [facility] to see if we need to start her on estrogen replacement therapy as we had discussed before. All questions are answered.  Plan: Advance diet Encourage ambulation Advance to PO medication Hep-Lock IV when tolerating full liquid diet  LOS: 1 day    Penny Stanley H 05/09/2014, 8:13 AM

## 2014-05-09 NOTE — Discharge Summary (Signed)
Physician Discharge Summary  Patient ID: Penny Stanley MRN: [id number] DOB/AGE: [birth year] 34 y.o.  Admit date: 05/08/2014 Discharge date: 05/09/2014  Admission Diagnoses: Pelvic pain, dysmenorrhea, menorrhagia, anemia, bilateral endometriomas   Discharge Diagnoses: Uterine fibroid, bilateral endometriomas, pelvic adhesions, stage IV endometriosis  Active Problems:   Postoperative state   Discharged Condition: good  Hospital Course: Patient was admitted on February 16 whereby she underwent a supracervical hysterectomy with bilateral salpingo-oophorectomy and extensive pelvic adhesive lysis. Intraoperative consultation with Dr. Georganna Skeans to help free the sigmoid colon which was obliterated as a result of endometriosis from the back of the uterus. Patient did well intraoperatively. Loss 750 cc of blood intraoperatively and postop hemoglobin was 10.3. Patient was kept on the PCA from an Foley catheter for first 24 hours. Upon removal patient was voiding spontaneously on the first morning postop she had good bowel sounds but did not begin passing gas until later in the afternoon whereby she was advanced from clear liquid to a regular diet. She maintained good vital signs and was afebrile and was anxious to go home on the evening rounds. Pathology report was discussed with the patient. She will return back to the office next week to have her staples removed.   Consults: general surgery intraoperative Dr. Georganna Skeans  Significant Diagnostic Studies: labs: Hemoglobin and hematocrit 10.3 and 32.2 respectively we'll do account 178,000 normal electrolytes.  Pathology report:Diagnosis Uterus, ovaries and fallopian tubes - LEIOMYOMATA. - ENDOMETRIUM: BENIGN ENDOMETRIAL POLYP AND ADJACENT BENIGN SECRETORY ENDOMETRIUM, NO ATYPIA, HYPERPLASIA, OR MALIGNANCY. - UTERINE SEROSA: ENDOMETRIOSIS AND ADHESIONS, NO EVIDENCE OF ATYPIA OR MALIGNANCY. - BILATERAL OVARIES AND FALLOPIAN TUBES:  ENDOMETRIOSIS, NO ATYPIA OR MALIGNANCY.  Treatments: surgery: Supracervical hysterectomy with bilateral salpingo-oophorectomy  Discharge Exam: Blood pressure 112/60, pulse 81, temperature 98.3 F (36.8 C), temperature source Oral, resp. rate 18, height 5\' 4"  (1.626 m), weight 217 lb (98.431 kg), SpO2 100 %. General appearance: alert  Lungs clear to auscultation Rogers or wheezes Heart: Regular rhythm no murmurs or gallops Abdomen: Soft nontender incision site and tacked positive bowel sounds Vagina: Vaginal pad dry Extremities: No cords or edema  Disposition: 01-Home or Self Care  Discharge Instructions    Call MD for:  severe or increased pain, loss or decreased feeling  in affected limb(s)    Complete by:  As directed      Call MD for:  temperature >100.5    Complete by:  As directed      Discharge instructions    Complete by:  As directed   Hysterectomy, Abdominal & Vaginal Care After Refer to this sheet in the next few weeks. These discharge instructions provide you with general information on caring for yourself after you leave the hospital. Your caregiver may also give you specific instructions. Your treatment has been planned according to the most current medical practices available, but unavoidable complications sometimes occur. If you have any problems or questions after discharge, please call your caregiver. HOME CARE INSTRUCTIONS Healing will take time. You will have tenderness at the surgery site. There may be some swelling and bruising around this area if you had an abdominal hysterectomy. Have an adult stay with you the first 48 to 72 hours after surgery, and then for 1 to 2 weeks afterward to help with daily activities. Only take over-the-counter or prescription medicines for pain, discomfort, or fever as directed by your caregiver.  Do not take aspirin. It can cause bleeding.  Do not drive when taking pain medicine.  It will be normal to be sore for a couple weeks after  surgery. See your caregiver if this seems to be getting worse rather than better.  Follow your caregiver's advice regarding diet, exercise, lifting, driving, and general activities.  Take showers instead of baths for a few weeks as directed.  You may resume your usual diet as directed.  Get plenty of rest and sleep.  Do not douche, use tampons, or have sexual intercourse until your caregiver says it is okay.  Change your bandages (dressings) as directed if you had an abdominal hysterectomy.  Take your temperature twice a day and write it down.  Do not drink alcohol until your caregiver says it is okay.  If you develop constipation, you may take a mild laxative with your caregiver's permission. Eating bran foods helps with constipation problems. Drink enough water and fluids to keep your urine clear or pale yellow.  Do not sign any legal documents until you feel normal again.  Keep all of your follow-up appointments.  Make sure you and your family understand everything about your operation and recovery.  SEEK MEDICAL CARE IF: There is swelling, redness, or increasing pain in the wound area.  Fluid (pus) is coming from the wound.  You notice a bad smell coming from the wound or surgical dressing.  You have pain, redness, and swelling from the intravenous (IV) site.  The wound breaks open.  You feel dizzy.  You develop pain or bleeding when you urinate.  You develop diarrhea.  You feel sick to your stomach (nauseous) and throw up (vomit).  You develop abnormal vaginal discharge.  You develop a rash.  You have any type of abnormal reaction or develop an allergy to your medicine.  Your pain medicine is not relieving your pain.  seek immediate medical care if: You have an oral temperature above 100, not controlled by medicine. HYYou develop chest pain.  You develop shortness of breath.  You pass out (faint).  You develop pain, swelling, or redness of your leg.  You develop heavy vaginal  bleeding, with or without blood clots.  MAKE SURE YOU: Understand these instructions.  Will watch your condition.  Will get help right away if you are not doing well or get worse.  Document Released: 05/30/2003 Document Re-Released: 08/27/2009 [facility] Patient Information 2011 [employer].   [facility] HMD5:55 PMTD@     Driving Restrictions    Complete by:  As directed   No driving for 1 week     Lifting restrictions    Complete by:  As directed   No lifting for 6 weeks     Resume previous diet    Complete by:  As directed             Medication List    STOP taking these medications        peg 3350 powder 100 G Solr  Commonly known as:  MOVIPREP     traMADol 50 MG tablet  Commonly known as:  ULTRAM      TAKE these medications        ibuprofen 800 MG tablet  Commonly known as:  ADVIL,MOTRIN  Take 1 tablet (800 mg total) by mouth 3 (three) times daily.     metoCLOPramide 10 MG tablet  Commonly known as:  REGLAN  Take 1 tablet (10 mg total) by mouth 3 (three) times daily with meals.     multivitamin-iron-minerals-folic acid chewable tablet  Chew 1 tablet by mouth daily.  naproxen sodium 220 MG tablet  Commonly known as:  ANAPROX  Take 440 mg by mouth daily as needed.     oxyCODONE-acetaminophen 5-325 MG per tablet  Commonly known as:  PERCOCET/ROXICET  Take 1-2 tablets by mouth every 6 (six) hours as needed for moderate pain or severe pain.        patient to return to the office on Monday, February 22 to have her staples removed  Signed: Terrance Mass 05/09/2014, 5:56 PM

## 2014-05-09 NOTE — Progress Notes (Signed)
Pt discharged home with husband... Will go to office on Monday for staple removal... COndition stable... No equipment... Taken to car via wheelchair by S. Isaiah Blakes, NT.

## 2014-05-09 NOTE — Progress Notes (Signed)
Wasted 57mL of Fentanyl PCA in sink. Zenaida Deed, RN witnessed.

## 2014-05-14 ENCOUNTER — Telehealth: Payer: Self-pay | Admitting: *Deleted

## 2014-05-14 ENCOUNTER — Encounter: Payer: Self-pay | Admitting: Gynecology

## 2014-05-14 ENCOUNTER — Ambulatory Visit (INDEPENDENT_AMBULATORY_CARE_PROVIDER_SITE_OTHER): Payer: Managed Care, Other (non HMO) | Admitting: Gynecology

## 2014-05-14 VITALS — BP 130/76

## 2014-05-14 DIAGNOSIS — E8940 Asymptomatic postprocedural ovarian failure: Secondary | ICD-10-CM | POA: Insufficient documentation

## 2014-05-14 DIAGNOSIS — N80109 Endometriosis of ovary, unspecified side, unspecified depth: Secondary | ICD-10-CM

## 2014-05-14 DIAGNOSIS — N958 Other specified menopausal and perimenopausal disorders: Secondary | ICD-10-CM

## 2014-05-14 DIAGNOSIS — N801 Endometriosis of ovary: Secondary | ICD-10-CM | POA: Insufficient documentation

## 2014-05-14 DIAGNOSIS — Z79890 Hormone replacement therapy (postmenopausal): Secondary | ICD-10-CM

## 2014-05-14 DIAGNOSIS — Z4802 Encounter for removal of sutures: Secondary | ICD-10-CM

## 2014-05-14 MED ORDER — ESTRADIOL 0.1 MG/24HR TD PTTW
1.0000 | MEDICATED_PATCH | TRANSDERMAL | Status: DC
Start: 2014-05-14 — End: 2014-06-25

## 2014-05-14 MED ORDER — EST ESTROGENS-METHYLTEST 1.25-2.5 MG PO TABS: 1.0000 | ORAL_TABLET | Freq: Every day | ORAL | Status: DC

## 2014-05-14 NOTE — Telephone Encounter (Signed)
Pt was seen today and prescribed estratest 1.25 mg daily, pt found out medication is not coved with insurance. However a patch is covered with insurance. Pt would like to switch. Please advise

## 2014-05-14 NOTE — Telephone Encounter (Signed)
Please call in Minivelle 0.1 mg patch to apply twice a week. # 1 month supply with 11 refills

## 2014-05-14 NOTE — Telephone Encounter (Signed)
Pt informed Rx sent. 

## 2014-05-14 NOTE — Patient Instructions (Signed)
Esterified Estrogens; Methyltestosterone tablets What is this medicine? ESTERIFIED ESTROGENS; METHYLTESTOSTERONE (es TAIR i fyed ES troe jenz; meth il tes TOS te rone) is a combination of hormones. This medicine is used to treat some of the symptoms of menopause like hot flashes and vaginal dryness. This medicine may be used for other purposes; ask your health care provider or pharmacist if you have questions. COMMON BRAND NAME(S): Covaryx, Covaryx H.S., EEMT, EEMT HS, Essian, Essian HS, Estratest, Estratest HS, Syntest DS, Syntest HS What should I tell my health care provider before I take this medicine? They need to know if you have any of these conditions: -abnormal vaginal bleeding -blood vessel disease or blood clots -breast, cervical, endometrial, ovarian, liver, or uterine cancer -dementia -diabetes -gallbladder disease -heart disease or recent heart attack -high blood pressure -high cholesterol -high level of calcium in the blood -hysterectomy -kidney disease -liver disease -migraine headaches -stroke -systemic lupus erythematosus (SLE) -tobacco smoker -vaginal bleeding -an unusual or allergic reaction to estrogens, other hormones, medicines, foods, dyes, or preservatives -pregnant or trying to get pregnant -breast-feeding How should I use this medicine? Take this medicine by mouth with a glass of water. To reduce nausea, this medicine may be taken with food. Follow the directions on the prescription label. Take this medicine at the same time each day. Do not take your medicine more often than directed. Talk to your pediatrician regarding the use of this medicine in children. Special care may be needed. A patient package insert for the product will be given with each prescription and refill. Read this sheet carefully each time. The sheet may change frequently. Overdosage: If you think you have taken too much of this medicine contact a poison control center or emergency room at  once. NOTE: This medicine is only for you. Do not share this medicine with others. What if I miss a dose? If you miss a dose, take it as soon as you can. If it is almost time for your next dose, take only that dose. Do not take double or extra doses. What may interact with this medicine? Do not take this medicine with any of the following medications: -medicines for cancer like aminoglutethimide, anastrozole, exemestane, letrozole, testolactone, vorozole This medicine may also interact with the following medications: -antibiotics like erythromycin, clarithromycin -carbamazepine -female hormones, like estrogens or progestins and birth control pills -grapefruit juice -herbal remedies for menopause or female problems -insulin -itraconazole -ketoconazole -medicines that treat or prevent blood clots like warfarin -oxyphenbutazone -phenobarbital -rifampin -ritonavir -St. John's Wort This list may not describe all possible interactions. Give your health care provider a list of all the medicines, herbs, non-prescription drugs, or dietary supplements you use. Also tell them if you smoke, drink alcohol, or use illegal drugs. Some items may interact with your medicine. What should I watch for while using this medicine? Visit your doctor or health care professional for regular checks on your progress. You will need a regular breast and pelvic exam and Pap smear while on this medicine. You should also discuss the need for regular mammograms with your health care professional, and follow his or her guidelines for these tests. This medicine can make your body retain fluid, making your fingers, hands, or ankles swell. Your blood pressure can go up. Contact your doctor or health care professional if you feel you are retaining fluid. If you have any reason to think you are pregnant, stop taking this medicine right away and contact your doctor or health care professional. Smoking  increases the risk of  getting a blood clot or having a stroke while you are taking this medicine, especially if you are more than 35 years old. You are strongly advised not to smoke. If you wear contact lenses and notice visual changes, or if the lenses begin to feel uncomfortable, consult your eye doctor or health care professional. This medicine can increase the risk of developing a condition (endometrial hyperplasia) that may lead to cancer of the lining of the uterus. Taking progestins, another hormone drug, with this medicine lowers the risk of developing this condition. Therefore, if your uterus has not been removed (by a hysterectomy), your doctor may prescribe a progestin for you to take together with your estrogen. You should know, however, that taking estrogens with progestins may have additional health risks. You should discuss the use of estrogens and progestins with your health care professional to determine the benefits and risks for you. If you are going to have surgery, you may need to stop taking this medicine. Consult your health care professional for advice before you schedule the surgery. What side effects may I notice from receiving this medicine? Side effects that you should report to your doctor or health care professional as soon as possible: -allergic reactions like skin rash, itching or hives, swelling of the face, lips, or tongue -breast tissue changes or discharge -changes in vision -chest pain -confusion, trouble speaking or understanding -dark urine -general ill feeling or flu-like symptoms -light-colored stools -nausea, vomiting -pain, swelling, warmth in the leg -right upper belly pain -severe headaches -shortness of breath -sudden numbness or weakness of the face, arm or leg -trouble walking, dizziness, loss of balance or coordination -unusual vaginal bleeding -yellowing of the eyes or skin Side effects that usually do not require medical attention (report to your doctor or health  care professional if they continue or are bothersome): -hair loss -increased hunger or thirst -increased urination -symptoms of vaginal infection like itching, irritation or unusual discharge -unusually weak or tired This list may not describe all possible side effects. Call your doctor for medical advice about side effects. You may report side effects to FDA at 1-800-FDA-1088. Where should I keep my medicine? Keep out of the reach of children. Store at room temperature between 15 and 30 degrees C (59 and 86 degrees F). Throw away any unused medicine after the expiration date. NOTE: This sheet is a summary. It may not cover all possible information. If you have questions about this medicine, talk to your doctor, pharmacist, or health care provider.  2015, Elsevier/Gold Standard. (2008-02-23 11:46:40) Hormone Therapy At menopause, your body begins making less estrogen and progesterone hormones. This causes the body to stop having menstrual periods. This is because estrogen and progesterone hormones control your periods and menstrual cycle. A lack of estrogen may cause symptoms such as:  Hot flushes (or hot flashes).  Vaginal dryness.  Dry skin.  Loss of sex drive.  Risk of bone loss (osteoporosis). When this happens, you may choose to take hormone therapy to get back the estrogen lost during menopause. When the hormone estrogen is given alone, it is usually referred to as ET (Estrogen Therapy). When the hormone progestin is combined with estrogen, it is generally called HT (Hormone Therapy). This was formerly known as hormone replacement therapy (HRT). Your caregiver can help you make a decision on what will be best for you. The decision to use HT seems to change often as new studies are done. Many studies do not  agree on the benefits of hormone replacement therapy. LIKELY BENEFITS OF HT INCLUDE PROTECTION FROM:  Hot Flushes (also called hot flashes) - A hot flush is a sudden feeling of  heat that spreads over the face and body. The skin may redden like a blush. It is connected with sweats and sleep disturbance. Women going through menopause may have hot flushes a few times a month or several times per day depending on the woman.  Osteoporosis (bone loss)- Estrogen helps guard against bone loss. After menopause, a woman's bones slowly lose calcium and become weak and brittle. As a result, bones are more likely to break. The hip, wrist, and spine are affected most often. Hormone therapy can help slow bone loss after menopause. Weight bearing exercise and taking calcium with vitamin D also can help prevent bone loss. There are also medications that your caregiver can prescribe that can help prevent osteoporosis.  Vaginal Dryness - Loss of estrogen causes changes in the vagina. Its lining may become thin and dry. These changes can cause pain and bleeding during sexual intercourse. Dryness can also lead to infections. This can cause burning and itching. (Vaginal estrogen treatment can help relieve pain, itching, and dryness.)  Urinary Tract Infections are more common after menopause because of lack of estrogen. Some women also develop urinary incontinence because of low estrogen levels in the vagina and bladder.  Possible other benefits of estrogen include a positive effect on mood and short-term memory in women. RISKS AND COMPLICATIONS  Using estrogen alone without progesterone causes the lining of the uterus to grow. This increases the risk of lining of the uterus (endometrial) cancer. Your caregiver should give another hormone called progestin if you have a uterus.  Women who take combined (estrogen and progestin) HT appear to have an increased risk of breast cancer. The risk appears to be small, but increases throughout the time that HT is taken.  Combined therapy also makes the breast tissue slightly denser which makes it harder to read mammograms (breast X-rays).  Combined,  estrogen and progesterone therapy can be taken together every day, in which case there may be spotting of blood. HT therapy can be taken cyclically in which case you will have menstrual periods. Cyclically means HT is taken for a set amount of days, then not taken, then this process is repeated.  HT may increase the risk of stroke, heart attack, breast cancer and forming blood clots in your leg.  Transdermal estrogen (estrogen that is absorbed through the skin with a patch or a cream) may have more positive results with:  Cholesterol.  Blood pressure.  Blood clots. Having the following conditions may indicate you should not have HT:  Endometrial cancer.  Liver disease.  Breast cancer.  Heart disease.  History of blood clots.  Stroke. TREATMENT   If you choose to take HT and have a uterus, usually estrogen and progestin are prescribed.  Your caregiver will help you decide the best way to take the medications.  Possible ways to take estrogen include:  Pills.  Patches.  Gels.  Sprays.  Vaginal estrogen cream, rings and tablets.  It is best to take the lowest dose possible that will help your symptoms and take them for the shortest period of time that you can.  Hormone therapy can help relieve some of the problems (symptoms) that affect women at menopause. Before making a decision about HT, talk to your caregiver about what is best for you. Be well informed and comfortable  with your decisions. HOME CARE INSTRUCTIONS   Follow your caregivers advice when taking the medications.  A Pap test is done to screen for cervical cancer.  The first Pap test should be done at age 13.  Between ages 58 and 57, Pap tests are repeated every 2 years.  Beginning at age 34, you are advised to have a Pap test every 3 years as long as your past 3 Pap tests have been normal.  Some women have medical problems that increase the chance of getting cervical cancer. Talk to your caregiver  about these problems. It is especially important to talk to your caregiver if a new problem develops soon after your last Pap test. In these cases, your caregiver may recommend more frequent screening and Pap tests.  The above recommendations are the same for women who have or have not gotten the vaccine for HPV (Human Papillomavirus).  If you had a hysterectomy for a problem that was not a cancer or a condition that could lead to cancer, then you no longer need Pap tests. However, even if you no longer need a Pap test, a regular exam is a good idea to make sure no other problems are starting.   If you are between ages 7 and 52, and you have had normal Pap tests going back 10 years, you no longer need Pap tests. However, even if you no longer need a Pap test, a regular exam is a good idea to make sure no other problems are starting.   If you have had past treatment for cervical cancer or a condition that could lead to cancer, you need Pap tests and screening for cancer for at least 20 years after your treatment.  If Pap tests have been discontinued, risk factors (such as a new sexual partner) need to be re-assessed to determine if screening should be resumed.  Some women may need screenings more often if they are at high risk for cervical cancer.  Get mammograms done as per the advice of your caregiver. SEEK IMMEDIATE MEDICAL CARE IF:  You develop abnormal vaginal bleeding.  You have pain or swelling in your legs, shortness of breath, or chest pain.  You develop dizziness or headaches.  You have lumps or changes in your breasts or armpits.  You have slurred speech.  You develop weakness or numbness of your arms or legs.  You have pain, burning, or bleeding when urinating.  You develop abdominal pain. Document Released: 12/06/2002 Document Revised: 06/01/2011 Document Reviewed: 03/26/2010 [facility] Patient Information 2015 [place], [place]. This information is not intended to  replace advice given to you by your health care provider. Make sure you discuss any questions you have with your health care provider.

## 2014-05-14 NOTE — Progress Notes (Signed)
   Patient presented to the office today to have her staples removed. Patient is status post supracervical hysterectomy with bilateral salpingo-oophorectomy and sigmoid mobilization from uterus along with pelvic adhesio lysis with Gen. surgery consultation on 05/08/2014 for severe stage IV endometriosis. Findings from surgery as well as pathology report as follows was discussed with the patient and husband:  FINDINGS:Bilateral endometriomas, extensive pelvic adhesions, obliterated culde sac  Stage IV Endometriosis   Diagnosis Uterus, ovaries and fallopian tubes - LEIOMYOMATA. - ENDOMETRIUM: BENIGN ENDOMETRIAL POLYP AND ADJACENT BENIGN SECRETORY ENDOMETRIUM, NO ATYPIA, HYPERPLASIA, OR MALIGNANCY. - UTERINE SEROSA: ENDOMETRIOSIS AND ADHESIONS, NO EVIDENCE OF ATYPIA OR MALIGNANCY. - BILATERAL OVARIES AND FALLOPIAN TUBES: ENDOMETRIOSIS, NO ATYPIA OR MALIGNANCY.  Patient was starting to experience some mild vasomotor symptoms. We will going to wait until this postop visit today to check her [facility] but now that we have pathology report confirming that both ovaries were removed we discussed starting her on estrogen replacement therapy and since she is young at 42 years her age to help with with her libido will go and start her on Estratest 1.25 mg daily. The risk benefits and pros and cons were discussed to include deep venous thrombosis and pulmonary embolism. We discussed the women's initiative study as well as recommendation of the lowest dose for the short amount of time. We'll begin to taper in the next several years. Her postop hemoglobin was 10.3. She's voiding well and having normal bowel movements without any problem. We are going to wait until she returns to the office for her first postop visit in 2-3 weeks and we'll check her CBC at that point and then also begin starting her calcium vitamin D for osteoporosis prevention.  Her staples were removed and incision was Steri-Stripped.

## 2014-05-15 LAB — FOLLICLE STIMULATING HORMONE: FSH: 22.3 m[IU]/mL

## 2014-06-04 ENCOUNTER — Ambulatory Visit: Payer: Managed Care, Other (non HMO) | Admitting: Gynecology

## 2014-06-05 ENCOUNTER — Encounter: Payer: Self-pay | Admitting: Gynecology

## 2014-06-05 ENCOUNTER — Ambulatory Visit (INDEPENDENT_AMBULATORY_CARE_PROVIDER_SITE_OTHER): Payer: Managed Care, Other (non HMO) | Admitting: Gynecology

## 2014-06-05 VITALS — BP 118/80 | Ht 65.0 in | Wt 218.0 lb

## 2014-06-05 DIAGNOSIS — D62 Acute posthemorrhagic anemia: Secondary | ICD-10-CM

## 2014-06-05 DIAGNOSIS — Z09 Encounter for follow-up examination after completed treatment for conditions other than malignant neoplasm: Secondary | ICD-10-CM

## 2014-06-05 DIAGNOSIS — Z79890 Hormone replacement therapy (postmenopausal): Secondary | ICD-10-CM

## 2014-06-05 DIAGNOSIS — E8940 Asymptomatic postprocedural ovarian failure: Secondary | ICD-10-CM | POA: Insufficient documentation

## 2014-06-05 LAB — CBC WITH DIFFERENTIAL/PLATELET
BASOS ABS: 0 10*3/uL (ref 0.0–0.1)
Basophils Relative: 0 % (ref 0–1)
EOS PCT: 3 % (ref 0–5)
Eosinophils Absolute: 0.2 10*3/uL (ref 0.0–0.7)
HEMATOCRIT: 34.2 % — AB (ref 36.0–46.0)
Hemoglobin: 10.7 g/dL — ABNORMAL LOW (ref 12.0–15.0)
LYMPHS ABS: 2.6 10*3/uL (ref 0.7–4.0)
LYMPHS PCT: 37 % (ref 12–46)
MCH: 24.8 pg — AB (ref 26.0–34.0)
MCHC: 31.3 g/dL (ref 30.0–36.0)
MCV: 79.2 fL (ref 78.0–100.0)
MPV: 9.3 fL (ref 8.6–12.4)
Monocytes Absolute: 0.4 10*3/uL (ref 0.1–1.0)
Monocytes Relative: 6 % (ref 3–12)
NEUTROS PCT: 54 % (ref 43–77)
Neutro Abs: 3.8 10*3/uL (ref 1.7–7.7)
PLATELETS: 280 10*3/uL (ref 150–400)
RBC: 4.32 MIL/uL (ref 3.87–5.11)
RDW: 15.5 % (ref 11.5–15.5)
WBC: 7.1 10*3/uL (ref 4.0–10.5)

## 2014-06-05 NOTE — Progress Notes (Signed)
    patient presented to the office for her 4 weeks postop visit. She is asymptomatic today. Patient is  status post supracervical hysterectomy with bilateral salpingo-oophorectomy and sigmoid mobilization from uterus along with pelvic adhesio lysis with Gen. surgery consultation on 05/08/2014 for severe stage IV endometriosis. Findings from surgery as well as pathology report as follows was discussed with the patient and husband:  FINDINGS:Bilateral endometriomas, extensive pelvic adhesions, obliterated culde sac  Stage IV Endometriosis   Diagnosis Uterus, ovaries and fallopian tubes - LEIOMYOMATA. - ENDOMETRIUM: BENIGN ENDOMETRIAL POLYP AND ADJACENT BENIGN SECRETORY ENDOMETRIUM, NO ATYPIA, HYPERPLASIA, OR MALIGNANCY. - UTERINE SEROSA: ENDOMETRIOSIS AND ADHESIONS, NO EVIDENCE OF ATYPIA OR MALIGNANCY. - BILATERAL OVARIES AND FALLOPIAN TUBES: ENDOMETRIOSIS, NO ATYPIA OR MALIGNANCY.  [facility] 05/14/2014 one week after her surgery was 22.3 patient was having vasomotor symptoms and was started on transdermal Minivelle estradiol patch 0.1 mg/24-hour that she applies twice a week. Her postop hemoglobin was 10.3 and she's currently on iron supplementation.  Exam: Blood pressure was 118/80 : Abdomen soft nontender no rebound or guarding. Pfannenstiel scar completely healed Pelvic: Bartholin urethra Skene was within normal limits Vagina: No lesions or discharge Cervix: No lesions or discharge Uterus: Absent Adnexa: No palpable mass or tenderness Rectal exam: Not done  Assessment/plan: Patient 4 weeks status post supracervical hysterectomy with bilateral salpingo-oophorectomy as a result of stage IV symptomatic endometriosis doing well on transdermal estrogen patch twice a week. She will continue her iron supplementation to her final postop visit in 2 weeks. We discussed importance of calcium vitamin D for osteoporosis prevention. We will check her CBC today to follow-up on her postop anemia.

## 2014-06-06 ENCOUNTER — Telehealth: Payer: Self-pay

## 2014-06-06 NOTE — Telephone Encounter (Signed)
I called patient to verify her planned return to work date as [employer] has requested her office visit notes and Dr. Durenda Guthrie note states "final post op visit in 2 weeks".  Left message for patient to call me.

## 2014-06-06 NOTE — Telephone Encounter (Signed)
Patient called back. She wants return to work date to be 06/27/14 as that is the first day of her work week that runs from [place]. Not a traditional work week she said. That date was provided to [facility] with her last office note.

## 2014-06-21 ENCOUNTER — Ambulatory Visit (INDEPENDENT_AMBULATORY_CARE_PROVIDER_SITE_OTHER): Payer: Managed Care, Other (non HMO) | Admitting: Gynecology

## 2014-06-21 ENCOUNTER — Encounter: Payer: Self-pay | Admitting: Gynecology

## 2014-06-21 VITALS — BP 130/76

## 2014-06-21 DIAGNOSIS — Z8742 Personal history of other diseases of the female genital tract: Secondary | ICD-10-CM

## 2014-06-21 DIAGNOSIS — D62 Acute posthemorrhagic anemia: Secondary | ICD-10-CM

## 2014-06-21 DIAGNOSIS — Z09 Encounter for follow-up examination after completed treatment for conditions other than malignant neoplasm: Secondary | ICD-10-CM

## 2014-06-21 NOTE — Progress Notes (Signed)
   Patient presented to the office her six-week postop visit. Patient complaining of vasomotor symptoms. Her history is as follows:  Patient is status post supracervical hysterectomy with bilateral salpingo-oophorectomy and sigmoid mobilization from uterus along with pelvic adhesio lysis with Gen. surgery consultation on 05/08/2014 for severe stage IV endometriosis.  FINDINGS:Bilateral endometriomas, extensive pelvic adhesions, obliterated culde sac  Stage IV Endometriosis   Pathology report: Diagnosis Uterus, ovaries and fallopian tubes - LEIOMYOMATA. - ENDOMETRIUM: BENIGN ENDOMETRIAL POLYP AND ADJACENT BENIGN SECRETORY ENDOMETRIUM, NO ATYPIA, HYPERPLASIA, OR MALIGNANCY. - UTERINE SEROSA: ENDOMETRIOSIS AND ADHESIONS, NO EVIDENCE OF ATYPIA OR MALIGNANCY. - BILATERAL OVARIES AND FALLOPIAN TUBES: ENDOMETRIOSIS, NO ATYPIA OR MALIGNANCY.  [facility] 05/14/2014 one week after her surgery was 22.3 patient was having vasomotor symptoms and was started on transdermal Minivelle estradiol patch 0.1 mg/24-hour that she applies twice a week. Her postop hemoglobin was 10.3 and she's currently on iron supplementation.  Exam: Blood pressure 130/76 Gen. appearance well-developed will nourished female in no acute distress Abdomen: Pfannenstiel incision completely healed. Soft nontender no rebound or guarding Pelvic: Bartholin urethra Skene was within normal limits Vagina: No lesions or discharge Cervix: No lesions or discharge Bimanual exam no palpable mass or tenderness Rectal exam not done  Assessment/plan: Patient 6 weeks status post supracervical hysterectomy with bilateral salpingo-oophorectomy as a result of stage IV symptomatic endometriosis having hot flashes on transdermal estrogen. We will change to Estratest 1.25 mg by mouth daily. Patient will return back to the office in 2 weeks for follow-up CBC meanwhile she will continue her iron supplementation. She was provided with a note to return back to  work and full normal activities. We will see her next year for her annual exam or when necessary.

## 2014-06-22 ENCOUNTER — Telehealth: Payer: Self-pay | Admitting: *Deleted

## 2014-06-22 NOTE — Telephone Encounter (Signed)
Prior authorization for estratest done online with Christella Scheuermann, will wait for response.

## 2014-06-25 MED ORDER — ESTROGENS CONJUGATED 1.25 MG PO TABS: 1.2500 mg | ORAL_TABLET | Freq: Every day | ORAL | Status: DC

## 2014-06-25 NOTE — Telephone Encounter (Signed)
Pt medication for Estratest 1.25 mg by mouth daily is not a covered medication on her drug list. Pt will need alternate medication, per note on 06/21/14 she has tried minivelle 0.1mg   patch. Please advise

## 2014-06-25 NOTE — Telephone Encounter (Signed)
Call in prescription for Premarin 1.25 mg . #30 refill x 11

## 2014-06-25 NOTE — Telephone Encounter (Signed)
Rx sent to pharmacy   

## 2014-07-05 ENCOUNTER — Other Ambulatory Visit: Payer: Managed Care, Other (non HMO)

## 2014-09-23 IMAGING — US US TRANSVAGINAL NON-OB
1 series · 13 of 25 positions shown · non-contrast
Comparison: Pelvic ultrasound most recent 09/22/2013, CT
abdomen/pelvis 09/17/2013

CLINICAL DATA: Tubo-ovarian abscess, fever.  Antibiotic treatment.

EXAM:
TRANSVAGINAL ULTRASOUND OF PELVIS
TECHNIQUE: Transvaginal ultrasound examination of the pelvis was performed
including evaluation of the uterus, ovaries, adnexal regions, and
pelvic cul-de-sac.

[Series 1: us transvaginal non-ob · 73 acquisitions, 13 frames shown]
[im 1/73]
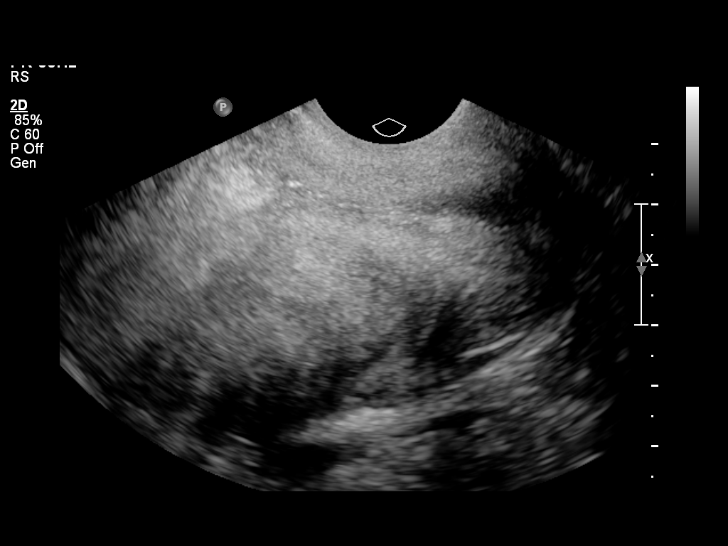
[im 7/73]
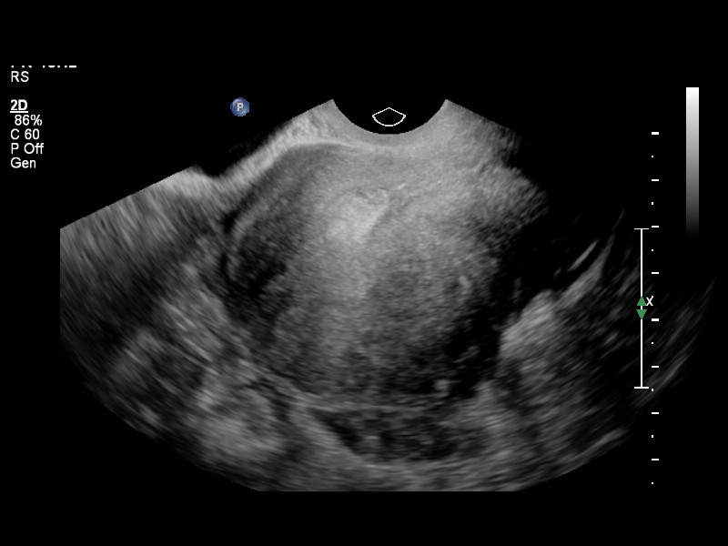
[im 13/73]
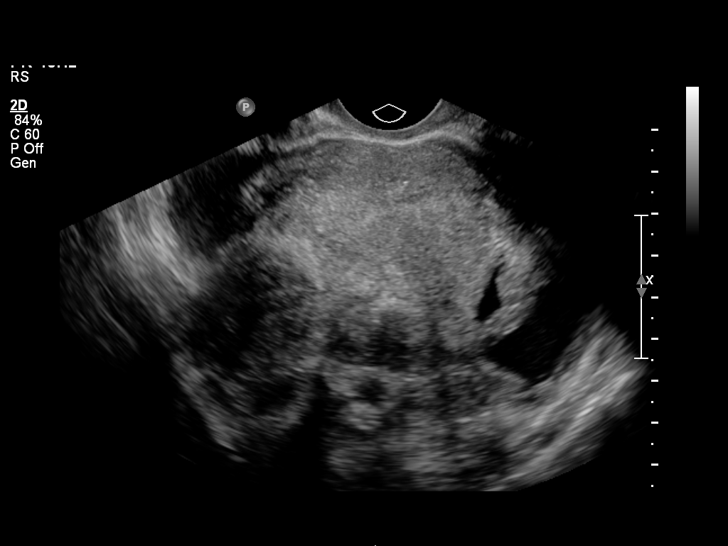
[im 19/73]
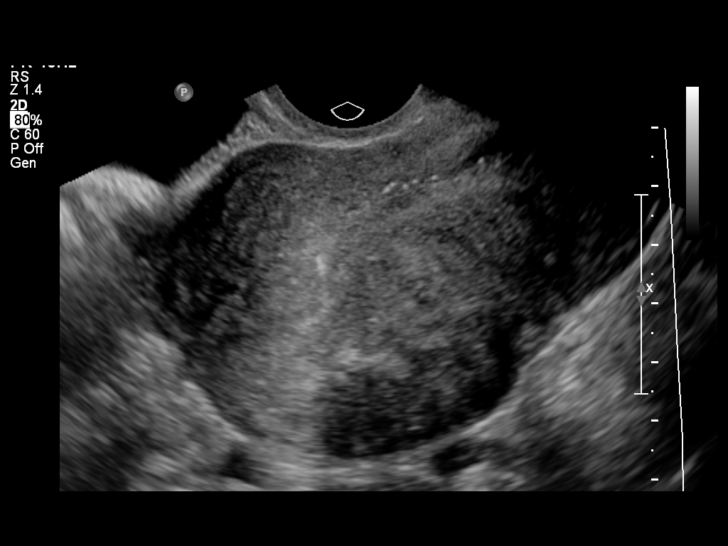
[im 25/73]
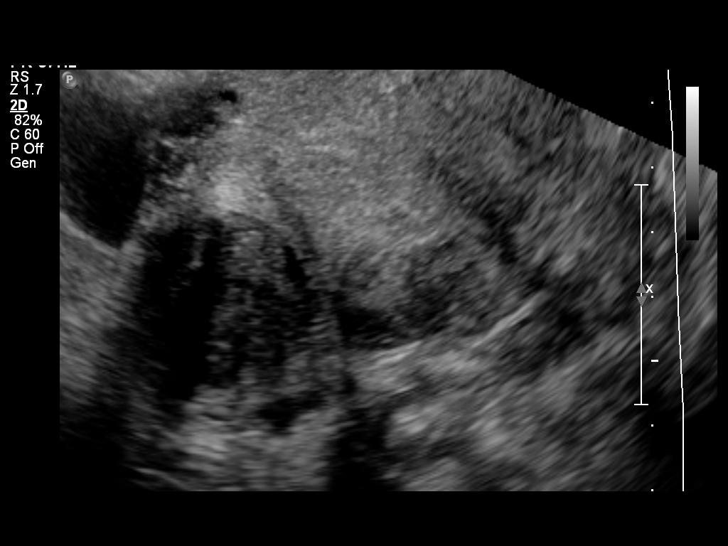
[im 31/73]
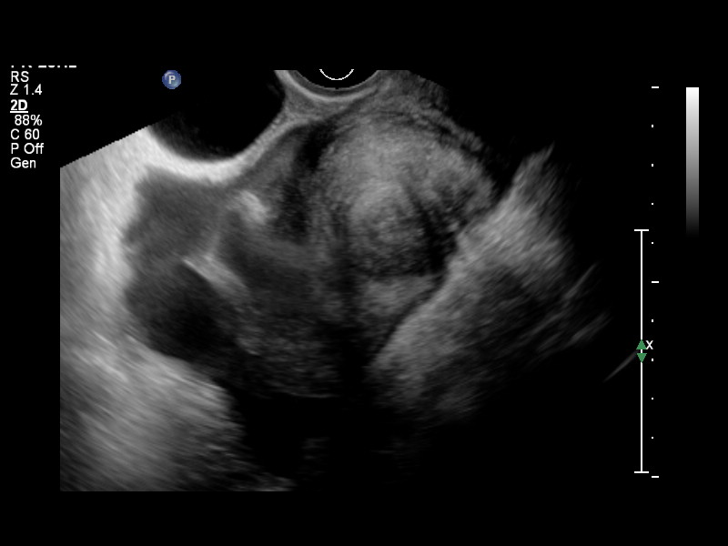
[im 37/73]
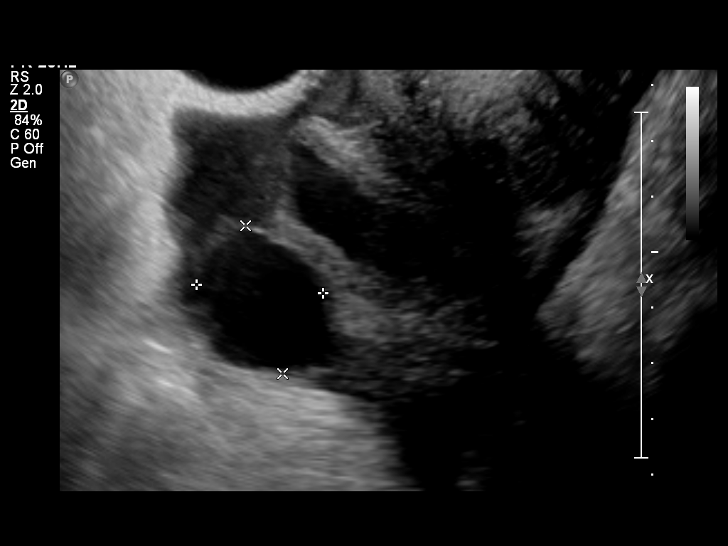
[im 43/73]
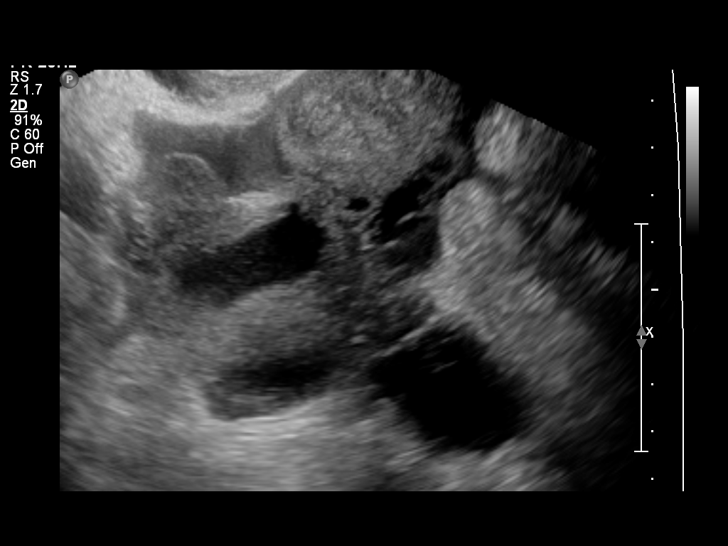
[im 49/73]
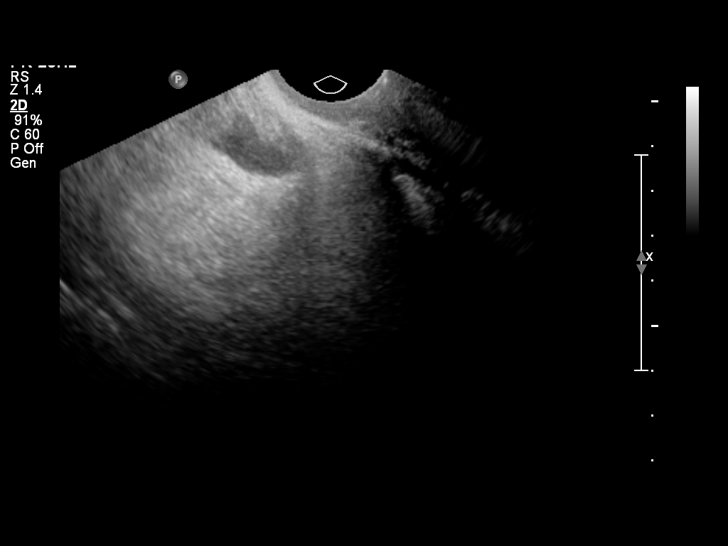
[im 55/73]
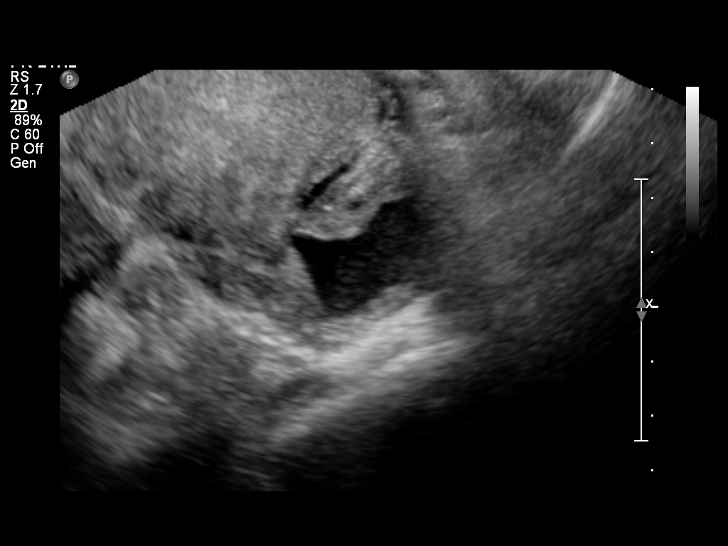
[im 61/73]
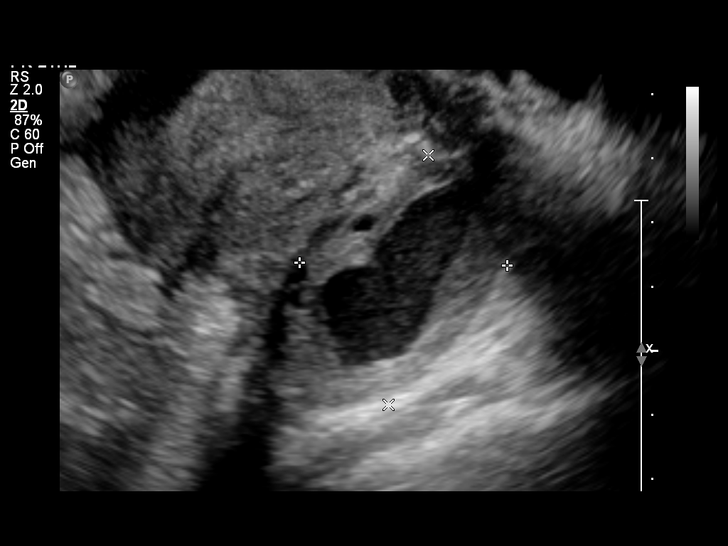
[im 67/73]
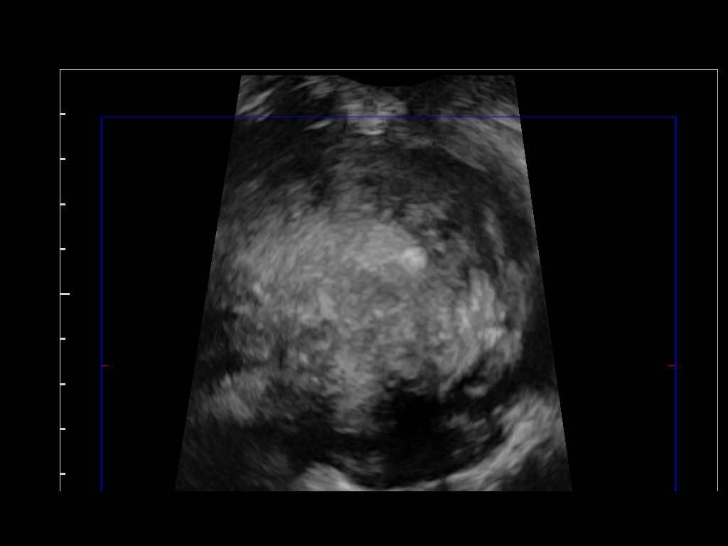
[im 73/73]
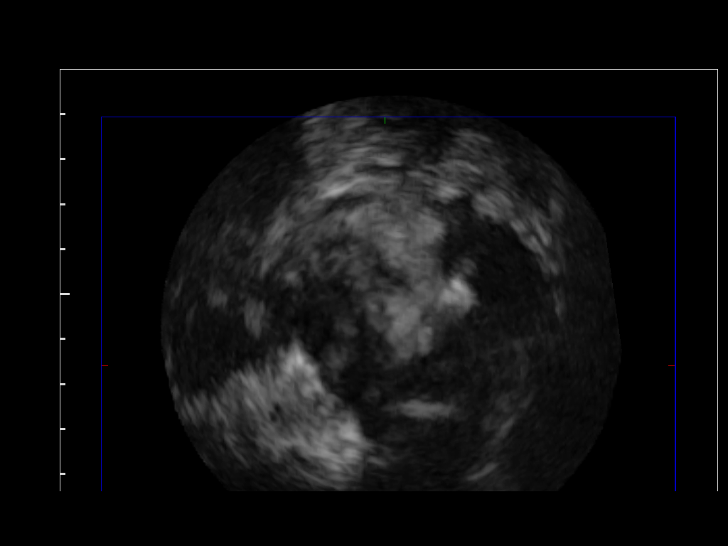

[13 of 25 positions shown; findings below may reference images not displayed]

FINDINGS: Uterus

Measurements: 9.2 x 6.1 x 6.1 cm. Globular and inhomogeneous with
ill definition of the endometrial/ myometrial junction. Right
posterior uterine body subserosal/intramural fibroid measures 3.0 x
2.2 x 2.0 cm..

Endometrium

Thickness: 8 mm, inhomogeneous. Not well visualized, without focal
abnormality.

Right ovary

Measurements: Right adnexal complex thick-walled cystic lesion now
measuring overall 5.0 x 4.7 x 4.5 cm. A dominant cystic portion has
either resolved or is decreased in size, with dominant thick-walled
cystic component now measuring 3.5 x 3.5 x 2.0 cm.. The right ovary
is not visualized separate from this area.

Left ovary

Measurements: 3.2 x 3.9 x 3.0 cm. Probable collapsing cyst with low
level internal echoes, 3.0 x 2.5 x 2.2 cm. This is smaller than
previously, most compatible with a resolving hemorrhagic cyst.

Other findings:  Moderate free fluid is identified.
IMPRESSION: Slight decrease in size of complex right adnexal cystic lesion.
Again, primary differential considerations include tubo-ovarian
abscess although involuting hemorrhagic cyst may appear similar.
Endometrioma is less likely.

Decrease in size of dominant left ovarian cyst likely representing
involuting physiologic cyst.

Globular, poorly defined endometrial/myometrial interface which may
suggest adenomyosis. This could be confirmed with MRI performed on a
nonemergent basis as an outpatient.

## 2014-11-06 IMAGING — CT CT ANGIO CHEST
2 of 9 series · 18 of 46 positions shown · IV contrast (Omni 300)
Comparison: Radiograph 12/06/2013

CLINICAL DATA: Recent surgery.  Short of breath.

EXAM:
CT ANGIOGRAPHY CHEST WITH CONTRAST
TECHNIQUE: Multidetector CT imaging of the chest was performed using the
standard protocol during bolus administration of intravenous
contrast. Multiplanar CT image reconstructions and MIPs were
obtained to evaluate the vascular anatomy.
CONTRAST:  100mL OMNIPAQUE IOHEXOL 350 MG/ML SOLN

[Series 5: thins · axial · 0.60mm/px · z∈[+1176,+1380]mm · 15 of 232 slices shown]
[im 14/232  lung]
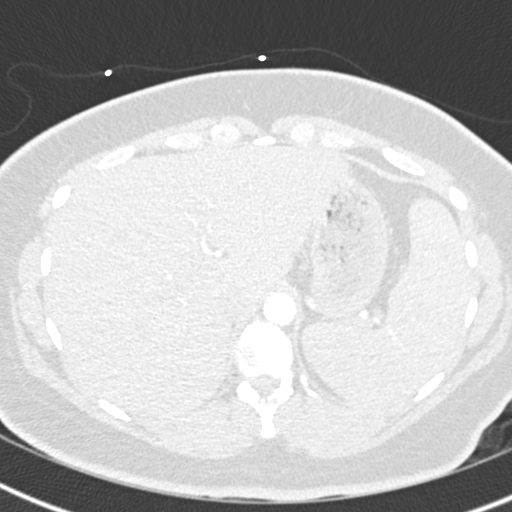
[im 28/232  soft-tissue]
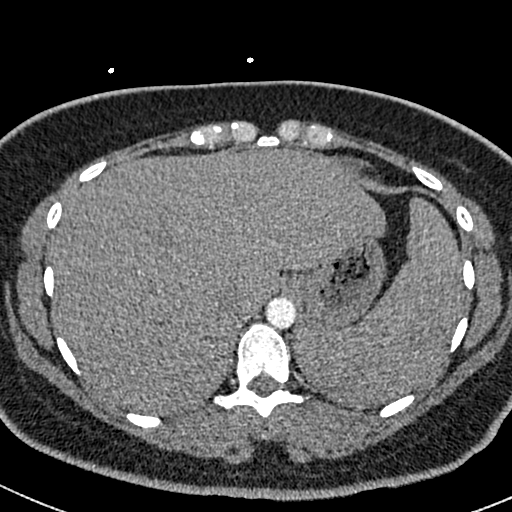
[im 41/232  lung]
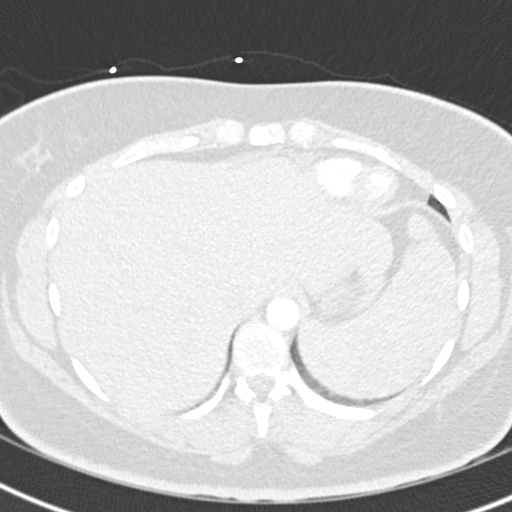
[im 55/232  soft-tissue]
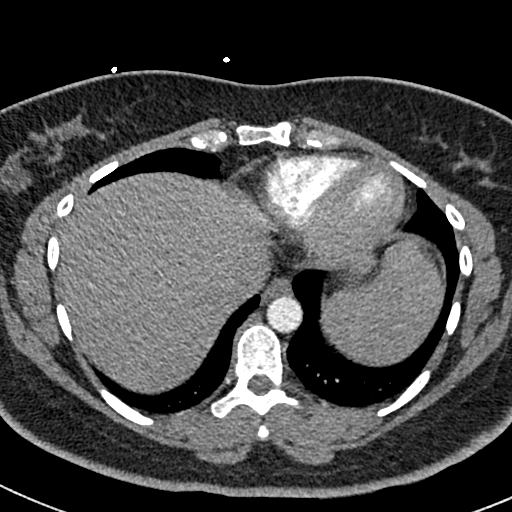
[im 68/232  lung]
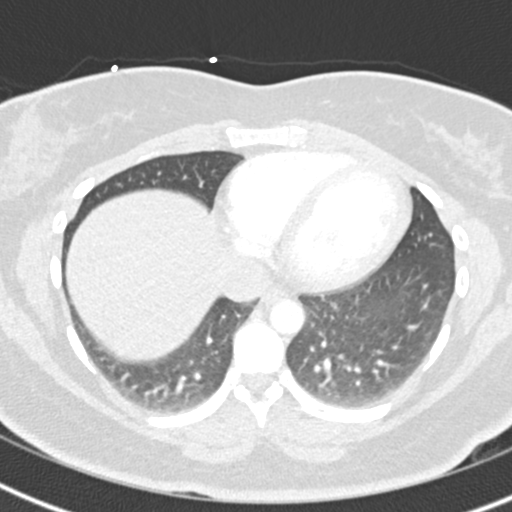
[im 82/232  soft-tissue]
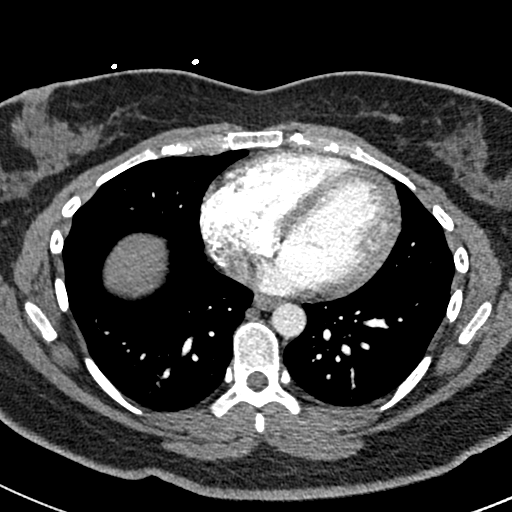
[im 96/232  lung]
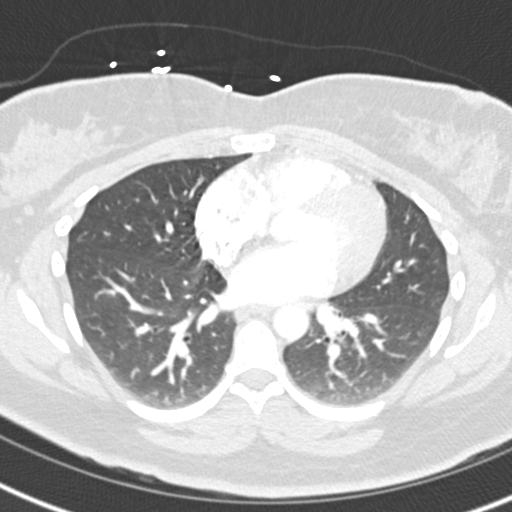
[im 123/232  soft-tissue]
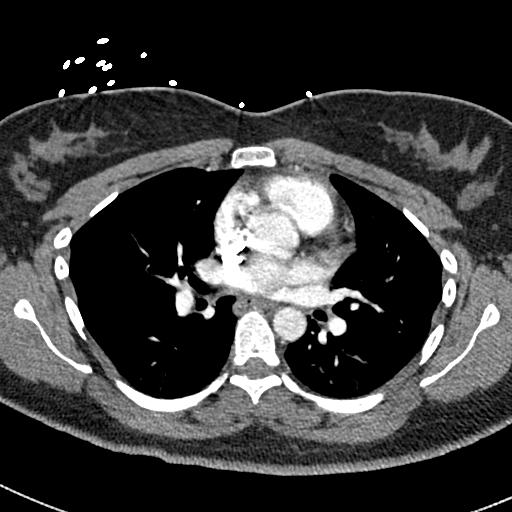
[im 136/232  lung]
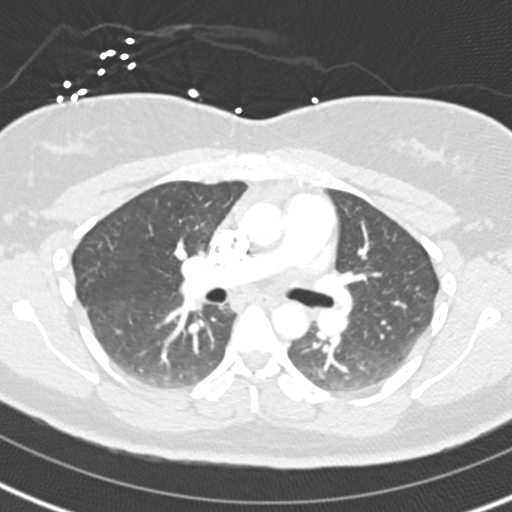
[im 150/232  soft-tissue]
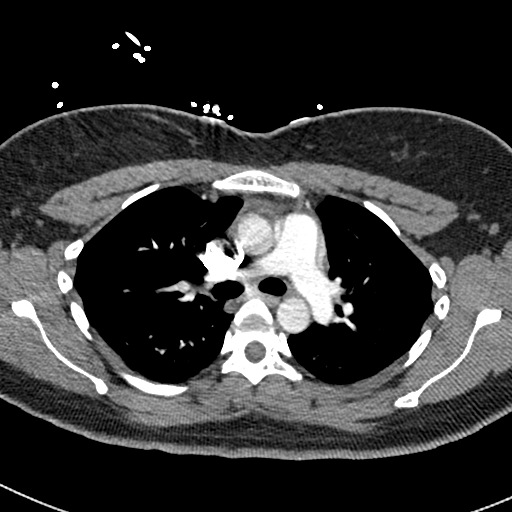
[im 164/232  lung]
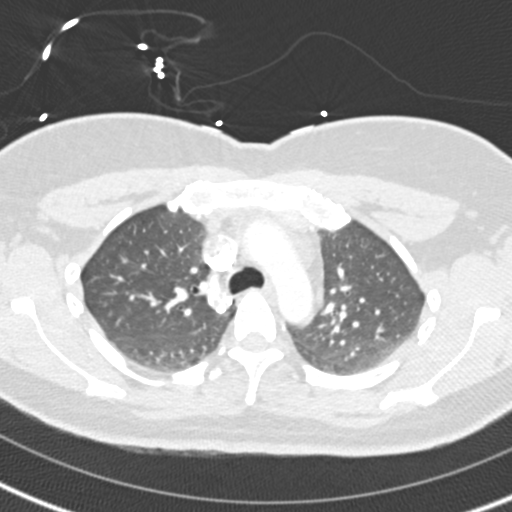
[im 177/232  soft-tissue]
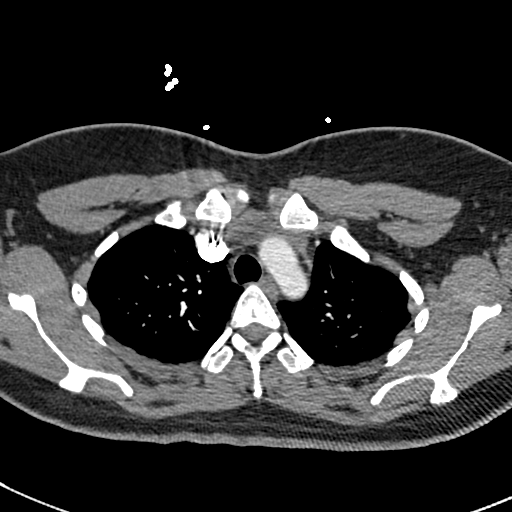
[im 191/232  lung]
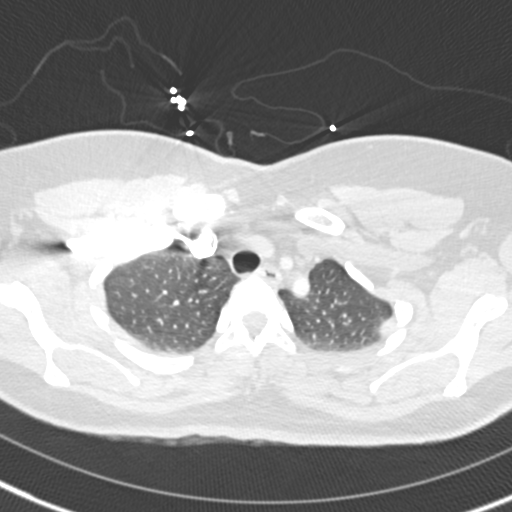
[im 204/232  soft-tissue]
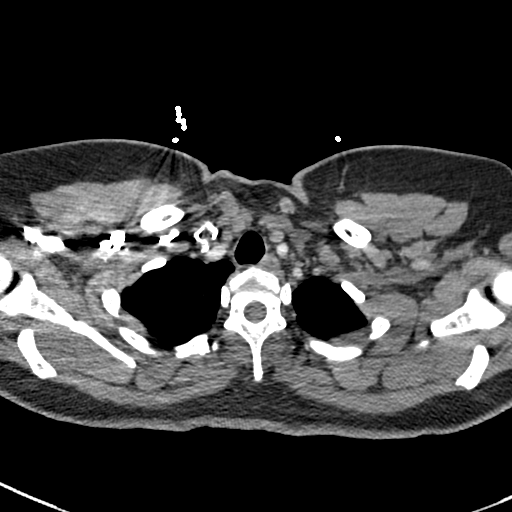
[im 218/232  lung]
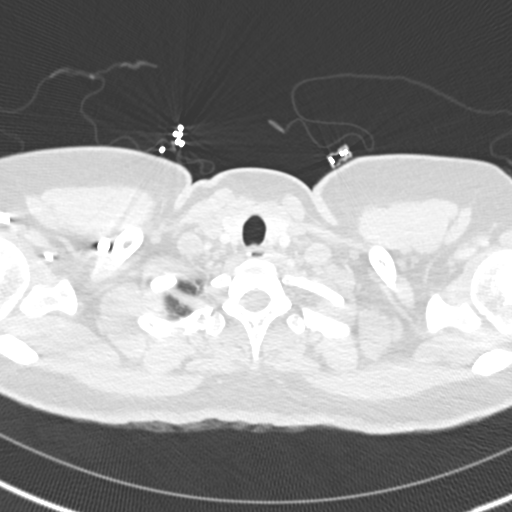

[Series 7: coronal mpr · coronal · 0.49mm/px · 3 of 104 slices shown]
[im 26/104  soft-tissue]
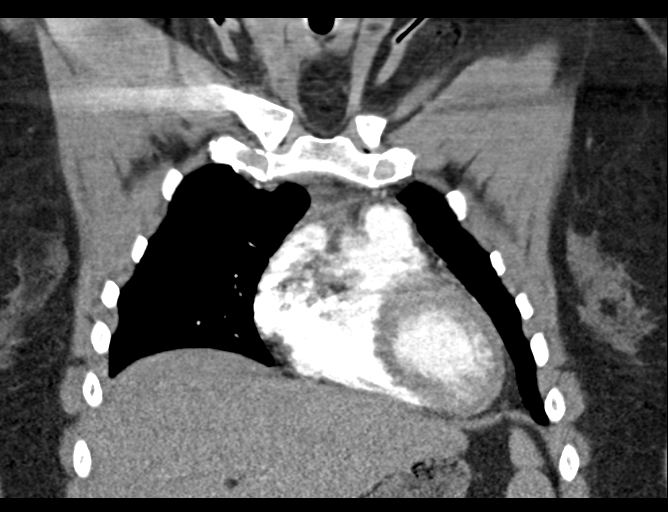
[im 52/104  soft-tissue]
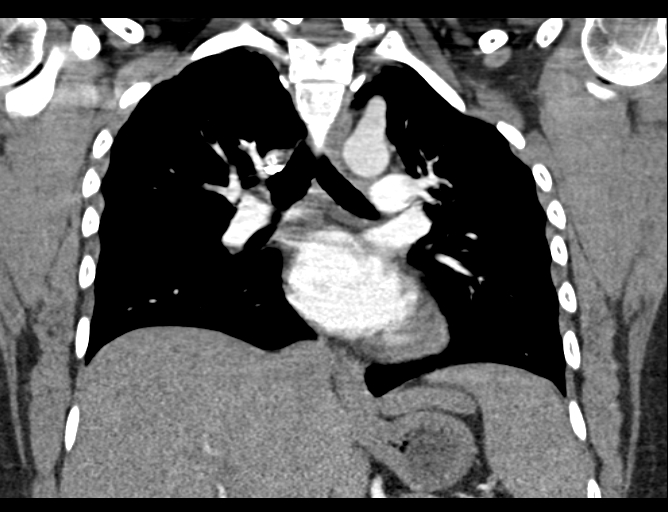
[im 78/104  soft-tissue]
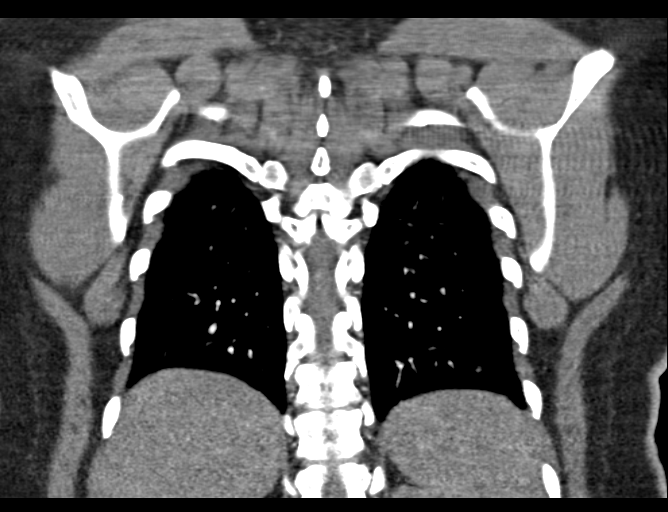

[18 of 46 positions shown; findings below may reference images not displayed]

FINDINGS: No filling defects within the pulmonary arteries to suggest acute
pulmonary embolism. No acute findings aorta great vessels. No
pericardial fluid. Esophagus is normal.

No axillary supraclavicular lymphadenopathy. No mediastinal
adenopathy

Review of the lung parenchyma demonstrates no pneumothorax or
pleural fluid. No infarction or infiltrate. Airways are normal.
Review of the upper abdomen is unremarkable. Limited view of the
skeleton is normal.

Review of the MIP images confirms the above findings.
IMPRESSION: No evidence acute pulmonary embolism.

## 2015-02-20 ENCOUNTER — Other Ambulatory Visit: Payer: Self-pay | Admitting: Gynecology

## 2015-02-20 MED ORDER — ESTROGENS CONJUGATED 1.25 MG PO TABS: 1.2500 mg | ORAL_TABLET | Freq: Every day | ORAL | Status: DC

## 2015-02-21 ENCOUNTER — Telehealth: Payer: Self-pay

## 2015-02-21 MED ORDER — EST ESTROGENS-METHYLTEST 1.25-2.5 MG PO TABS
1.0000 | ORAL_TABLET | Freq: Every day | ORAL | Status: DC
Start: 2015-02-21 — End: 2015-04-05

## 2015-02-21 NOTE — Telephone Encounter (Signed)
Rx for generic Estratest called in and Premarin Rx was cancelled.

## 2015-02-21 NOTE — Telephone Encounter (Signed)
Butch Penny spoke with patient and sent me the following message: "my just called and said  She had d/c the premarin due to cost.  And takes the estratest since its cheaper.  Go figure.    I read your note to her that chart stated in April she d/c estratest due to cost. And she said no it is the opposite.........."

## 2015-04-04 ENCOUNTER — Encounter: Payer: Managed Care, Other (non HMO) | Admitting: Gynecology

## 2015-04-05 ENCOUNTER — Ambulatory Visit (INDEPENDENT_AMBULATORY_CARE_PROVIDER_SITE_OTHER): Payer: Managed Care, Other (non HMO) | Admitting: Gynecology

## 2015-04-05 ENCOUNTER — Encounter: Payer: Self-pay | Admitting: Gynecology

## 2015-04-05 VITALS — BP 128/82 | Ht 65.0 in | Wt 229.0 lb

## 2015-04-05 DIAGNOSIS — N958 Other specified menopausal and perimenopausal disorders: Secondary | ICD-10-CM

## 2015-04-05 DIAGNOSIS — E8940 Asymptomatic postprocedural ovarian failure: Secondary | ICD-10-CM

## 2015-04-05 DIAGNOSIS — Z23 Encounter for immunization: Secondary | ICD-10-CM | POA: Diagnosis not present

## 2015-04-05 DIAGNOSIS — Z01419 Encounter for gynecological examination (general) (routine) without abnormal findings: Secondary | ICD-10-CM

## 2015-04-05 MED ORDER — ESTRADIOL 1 MG PO TABS: 1.0000 mg | ORAL_TABLET | Freq: Every day | ORAL | Status: DC

## 2015-04-05 NOTE — Patient Instructions (Addendum)
Estradiol tablets What is this medicine? ESTRADIOL (es tra DYE ole) is an estrogen. It is mostly used as hormone replacement in menopausal women. It helps to treat hot flashes and prevent osteoporosis. It is also used to treat women with low estrogen levels or those who have had their ovaries removed. This medicine may be used for other purposes; ask your health care provider or pharmacist if you have questions. What should I tell my health care provider before I take this medicine? They need to know if you have or ever had any of these conditions: -abnormal vaginal bleeding -blood vessel disease or blood clots -breast, cervical, endometrial, ovarian, liver, or uterine cancer -dementia -diabetes -gallbladder disease -heart disease or recent heart attack -high blood pressure -high cholesterol -high level of calcium in the blood -hysterectomy -kidney disease -liver disease -migraine headaches -protein C deficiency -protein S deficiency -stroke -systemic lupus erythematosus (SLE) -tobacco smoker -an unusual or allergic reaction to estrogens, other hormones, medicines, foods, dyes, or preservatives -pregnant or trying to get pregnant -breast-feeding How should I use this medicine? Take this medicine by mouth. To reduce nausea, this medicine may be taken with food. Follow the directions on the prescription label. Take this medicine at the same time each day and in the order directed on the package. Do not take your medicine more often than directed. Contact your pediatrician regarding the use of this medicine in children. Special care may be needed. A patient package insert for the product will be given with each prescription and refill. Read this sheet carefully each time. The sheet may change frequently. Overdosage: If you think you have taken too much of this medicine contact a poison control center or emergency room at once. NOTE: This medicine is only for you. Do not share this  medicine with others. What if I miss a dose? If you miss a dose, take it as soon as you can. If it is almost time for your next dose, take only that dose. Do not take double or extra doses. What may interact with this medicine? Do not take this medicine with any of the following medications: -aromatase inhibitors like aminoglutethimide, anastrozole, exemestane, letrozole, testolactone This medicine may also interact with the following medications: -carbamazepine -certain antibiotics used to treat infections -certain barbiturates or benzodiazepines used for inducing sleep or treating seizures -grapefruit juice -medicines for fungus infections like itraconazole and ketoconazole -raloxifene or tamoxifen -rifabutin, rifampin, or rifapentine -ritonavir -St. John's Wort -warfarin This list may not describe all possible interactions. Give your health care provider a list of all the medicines, herbs, non-prescription drugs, or dietary supplements you use. Also tell them if you smoke, drink alcohol, or use illegal drugs. Some items may interact with your medicine. What should I watch for while using this medicine? Visit your doctor or health care professional for regular checks on your progress. You will need a regular breast and pelvic exam and Pap smear while on this medicine. You should also discuss the need for regular mammograms with your health care professional, and follow his or her guidelines for these tests. This medicine can make your body retain fluid, making your fingers, hands, or ankles swell. Your blood pressure can go up. Contact your doctor or health care professional if you feel you are retaining fluid. If you have any reason to think you are pregnant, stop taking this medicine right away and contact your doctor or health care professional. Smoking increases the risk of getting a blood clot or having  a stroke while you are taking this medicine, especially if you are more than 36 years  old. You are strongly advised not to smoke. If you wear contact lenses and notice visual changes, or if the lenses begin to feel uncomfortable, consult your eye doctor or health care professional. This medicine can increase the risk of developing a condition (endometrial hyperplasia) that may lead to cancer of the lining of the uterus. Taking progestins, another hormone drug, with this medicine lowers the risk of developing this condition. Therefore, if your uterus has not been removed (by a hysterectomy), your doctor may prescribe a progestin for you to take together with your estrogen. You should know, however, that taking estrogens with progestins may have additional health risks. You should discuss the use of estrogens and progestins with your health care professional to determine the benefits and risks for you. If you are going to have surgery, you may need to stop taking this medicine. Consult your health care professional for advice before you schedule the surgery. What side effects may I notice from receiving this medicine? Side effects that you should report to your doctor or health care professional as soon as possible: -allergic reactions like skin rash, itching or hives, swelling of the face, lips, or tongue -breast tissue changes or discharge -changes in vision -chest pain -confusion, trouble speaking or understanding -dark urine -general ill feeling or flu-like symptoms -light-colored stools -nausea, vomiting -pain, swelling, warmth in the leg -right upper belly pain -severe headaches -shortness of breath -sudden numbness or weakness of the face, arm or leg -trouble walking, dizziness, loss of balance or coordination -unusual vaginal bleeding -yellowing of the eyes or skin Side effects that usually do not require medical attention (report to your doctor or health care professional if they continue or are bothersome): -hair loss -increased hunger or thirst -increased  urination -symptoms of vaginal infection like itching, irritation or unusual discharge -unusually weak or tired This list may not describe all possible side effects. Call your doctor for medical advice about side effects. You may report side effects to FDA at 1-800-FDA-1088. Where should I keep my medicine? Keep out of the reach of children. Store at room temperature between 20 and 25 degrees C (68 and 77 degrees F). Keep container tightly closed. Protect from light. Throw away any unused medicine after the expiration date. NOTE: This sheet is a summary. It may not cover all possible information. If you have questions about this medicine, talk to your doctor, pharmacist, or health care provider.    2016, Elsevier/Gold Standard. (2010-06-11 09:19:24) Tdap Vaccine (Tetanus, Diphtheria and Pertussis): What You Need to Know 1. Why get vaccinated? Tetanus, diphtheria and pertussis are very serious diseases. Tdap vaccine can protect [place] from these diseases. And, Tdap vaccine given to pregnant women can protect newborn babies against pertussis. TETANUS (Lockjaw) is rare in the [place] today. It causes painful muscle tightening and stiffness, usually all over the body.  It can lead to tightening of muscles in the head and neck so you can't open your mouth, swallow, or sometimes even breathe. Tetanus kills about 1 out of 10 people who are infected even after receiving the best medical care. DIPHTHERIA is also rare in the [place] today. It can cause a thick coating to form in the back of the throat.  It can lead to breathing problems, heart failure, paralysis, and death. PERTUSSIS (Whooping Cough) causes severe coughing spells, which can cause difficulty breathing, vomiting and disturbed sleep.  It can also lead to weight loss, incontinence, and rib fractures. Up to 2 in 100 adolescents and 5 in 100 adults with pertussis are hospitalized or have complications, which could include pneumonia or  death. These diseases are caused by bacteria. Diphtheria and pertussis are spread from person to person through secretions from coughing or sneezing. Tetanus enters the body through cuts, scratches, or wounds. Before vaccines, as many as 200,000 cases of diphtheria, 200,000 cases of pertussis, and hundreds of cases of tetanus, were reported in the [place] each year. Since vaccination began, reports of cases for tetanus and diphtheria have dropped by about 99% and for pertussis by about 80%. 2. Tdap vaccine Tdap vaccine can protect adolescents and adults from tetanus, diphtheria, and pertussis. One dose of Tdap is routinely given at age 76 or 50. People who did not get Tdap at that age should get it as soon as possible. Tdap is especially important for healthcare professionals and anyone having close contact with a baby younger than 12 months. Pregnant women should get a dose of Tdap during every pregnancy, to protect the newborn from pertussis. Infants are most at risk for severe, life-threatening complications from pertussis. Another vaccine, called Td, protects against tetanus and diphtheria, but not pertussis. A Td booster should be given every 10 years. Tdap may be given as one of these boosters if you have never gotten Tdap before. Tdap may also be given after a severe cut or burn to prevent tetanus infection. Your doctor or the person giving you the vaccine can give you more information. Tdap may safely be given at the same time as other vaccines. 3. Some people should not get this vaccine  A person who has ever had a life-threatening allergic reaction after a previous dose of any diphtheria, tetanus or pertussis containing vaccine, OR has a severe allergy to any part of this vaccine, should not get Tdap vaccine. Tell the person giving the vaccine about any severe allergies.  Anyone who had coma or long repeated seizures within 7 days after a childhood dose of DTP or DTaP, or a previous  dose of Tdap, should not get Tdap, unless a cause other than the vaccine was found. They can still get Td.  Talk to your doctor if you:  have seizures or another nervous system problem,  had severe pain or swelling after any vaccine containing diphtheria, tetanus or pertussis,  ever had a condition called Guillain-Barr Syndrome (GBS),  aren't feeling well on the day the shot is scheduled. 4. Risks With any medicine, including vaccines, there is a chance of side effects. These are usually mild and go away on their own. Serious reactions are also possible but are rare. Most people who get Tdap vaccine do not have any problems with it. Mild problems following Tdap (Did not interfere with activities)  Pain where the shot was given (about 3 in 4 adolescents or 2 in 3 adults)  Redness or swelling where the shot was given (about 1 person in 5)  Mild fever of at least 100.55F (up to about 1 in 25 adolescents or 1 in 100 adults)  Headache (about 3 or 4 people in 10)  Tiredness (about 1 person in 3 or 4)  Nausea, vomiting, diarrhea, stomach ache (up to 1 in 4 adolescents or 1 in 10 adults)  Chills, sore joints (about 1 person in 10)  Body aches (about 1 person in 3 or 4)  Rash, swollen glands (uncommon) Moderate problems following Tdap (  Interfered with activities, but did not require medical attention)  Pain where the shot was given (up to 1 in 5 or 6)  Redness or swelling where the shot was given (up to about 1 in 16 adolescents or 1 in 12 adults)  Fever over 102F (about 1 in 100 adolescents or 1 in 250 adults)  Headache (about 1 in 7 adolescents or 1 in 10 adults)  Nausea, vomiting, diarrhea, stomach ache (up to 1 or 3 people in 100)  Swelling of the entire arm where the shot was given (up to about 1 in 500). Severe problems following Tdap (Unable to perform usual activities; required medical attention)  Swelling, severe pain, bleeding and redness in the arm where the  shot was given (rare). Problems that could happen after any vaccine:  People sometimes faint after a medical procedure, including vaccination. Sitting or lying down for about 15 minutes can help prevent fainting, and injuries caused by a fall. Tell your doctor if you feel dizzy, or have vision changes or ringing in the ears.  Some people get severe pain in the shoulder and have difficulty moving the arm where a shot was given. This happens very rarely.  Any medication can cause a severe allergic reaction. Such reactions from a vaccine are very rare, estimated at fewer than 1 in a million doses, and would happen within a few minutes to a few hours after the vaccination. As with any medicine, there is a very remote chance of a vaccine causing a serious injury or death. The safety of vaccines is always being monitored. For more information, visit: http://www.aguilar.org/ 5. What if there is a serious problem? What should I look for?  Look for anything that concerns you, such as signs of a severe allergic reaction, very high fever, or unusual behavior.  Signs of a severe allergic reaction can include hives, swelling of the face and throat, difficulty breathing, a fast heartbeat, dizziness, and weakness. These would usually start a few minutes to a few hours after the vaccination. What should I do?  If you think it is a severe allergic reaction or other emergency that can't wait, call 9-1-1 or get the person to the nearest hospital. Otherwise, call your doctor.  Afterward, the reaction should be reported to the Vaccine Adverse Event Reporting System (VAERS). Your doctor might file this report, or you can do it yourself through the VAERS web site at www.vaers.SamedayNews.es, or by calling 773-325-4565. VAERS does not give medical advice.  6. The National Vaccine Injury Compensation Program The [employer] Vaccine Injury Compensation Program (VICP) is a federal program that was created to compensate  people who may have been injured by certain vaccines. Persons who believe they may have been injured by a vaccine can learn about the program and about filing a claim by calling 587-486-4318 or visiting the [place] website at GoldCloset.com.ee. There is a time limit to file a claim for compensation. 7. How can I learn more?  Ask your doctor. He or she can give you the vaccine package insert or suggest other sources of information.  Call your local or state health department.  Contact the Centers for Disease Control and Prevention (CDC):  Call 563-291-2826 (1-800-CDC-INFO) or  Visit CDC's website at http://hunter.com/ CDC Tdap Vaccine VIS (05/16/13)   This information is not intended to replace advice given to you by your health care provider. Make sure you discuss any questions you have with your health care provider.   Document Released: 09/08/2011 Document  Revised: 03/30/2014 Document Reviewed: 06/21/2013 Elsevier Interactive Patient Education [employer].

## 2015-04-05 NOTE — Addendum Note (Signed)
Addended by: Thurnell Garbe A on: 04/05/2015 02:04 PM   Modules accepted: Orders, SmartSet

## 2015-04-05 NOTE — Progress Notes (Signed)
Penny Stanley 04/29/1980 [id number]   History:    35 y.o.  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 It has help with her libido and hot flashes. Her history is as follows:  Patient is  status post supracervical hysterectomy with bilateral salpingo-oophorectomy and sigmoid mobilization from uterus along with pelvic adhesio lysis with Gen. surgery consultation on 05/08/2014 for severe stage IV endometriosis. Findings from surgery as well as pathology report as follows was discussed with the patient and husband:  FINDINGS:Bilateral endometriomas, extensive pelvic adhesions, obliterated culde sac  Stage IV Endometriosis   Diagnosis Uterus, ovaries and fallopian tubes - LEIOMYOMATA. - ENDOMETRIUM: BENIGN ENDOMETRIAL POLYP AND ADJACENT BENIGN SECRETORY ENDOMETRIUM, NO ATYPIA, HYPERPLASIA, OR MALIGNANCY. - UTERINE SEROSA: ENDOMETRIOSIS AND ADHESIONS, NO EVIDENCE OF ATYPIA OR MALIGNANCY. - BILATERAL OVARIES AND FALLOPIAN TUBES: ENDOMETRIOSIS, NO ATYPIA OR MALIGNANCY.  [facility] 05/14/2014 one week after her surgery was 22.3 patient was having vasomotor symptoms   Patient prior to her hysterectomy never had any history of any abnormal Pap smears. She does not recall ever having received the Tdap vaccine. She is currently taking calcium and vitamin D.  Past medical history,surgical history, family history and social history were all reviewed and documented in the EPIC chart.  Gynecologic History Patient's last menstrual period was 02/08/2013. Contraception: status post hysterectomy Last Pap: 2015. Results were: normal Last mammogram: Not indicated. Results were: Not indicated  Obstetric History OB History  Gravida Para Term Preterm AB SAB TAB Ectopic Multiple Living  1 1 1       1     # Outcome Date GA Lbr Len/2nd Weight Sex Delivery Anes PTL Lv  1 Term 04/23/08    F     Y       ROS: A ROS was performed and pertinent positives and negatives are included in the history.  GENERAL: No fevers or chills. HEENT: No change in vision, no earache, sore throat or sinus congestion. NECK: No pain or stiffness. CARDIOVASCULAR: No chest pain or pressure. No palpitations. PULMONARY: No shortness of breath, cough or wheeze. GASTROINTESTINAL: No abdominal pain, nausea, vomiting or diarrhea, melena or bright red blood per rectum. GENITOURINARY: No urinary frequency, urgency, hesitancy or dysuria. MUSCULOSKELETAL: No joint or muscle pain, no back pain, no recent trauma. DERMATOLOGIC: No rash, no itching, no lesions. ENDOCRINE: No polyuria, polydipsia, no heat or cold intolerance. No recent change in weight. HEMATOLOGICAL: No anemia or easy bruising or bleeding. NEUROLOGIC: No headache, seizures, numbness, tingling or weakness. PSYCHIATRIC: No depression, no loss of interest in normal activity or change in sleep pattern.     Exam: chaperone present  BP 128/82 mmHg  Ht 5\' 5"  (1.651 m)  Wt 229 lb (103.874 kg)  BMI 38.11 kg/m2  LMP 02/08/2013  Body mass index is 38.11 kg/(m^2).  General appearance : Well developed well nourished female. No acute distress HEENT: Eyes: no retinal hemorrhage or exudates,  Neck supple, trachea midline, no carotid bruits, no thyroidmegaly Lungs: Clear to auscultation, no rhonchi or wheezes, or rib retractions  Heart: Regular rate and rhythm, no murmurs or gallops Breast:Examined in sitting and supine position were symmetrical in appearance, no palpable masses or tenderness,  no skin retraction, no nipple inversion, no nipple discharge, no skin discoloration, no axillary or supraclavicular lymphadenopathy Abdomen: no palpable masses or tenderness, no rebound or guarding Extremities: no edema or skin  discoloration or tenderness  Pelvic:  Bartholin, Urethra, Skene Glands: Within normal limits             Vagina: No gross lesions or  discharge  Cervix: No gross lesions or discharge  Uterus absent history of supracervical hysterectomy  Adnexa  Without masses or tenderness  Anus and perineum  normal   Rectovaginal  normal sphincter tone without palpated masses or tenderness             Hemoccult not indicated     Assessment/Plan:  35 y.o. female for annual exam will be switched from Estratest 1.25 mg daily to Estrace 1 mg by mouth daily for surgical menopause and to monitor her pruritus after eliminating the testosterone component of the Estratest. Patient will return back to the office sometime next week for her blood work in a fasting state consisting of the following: Comprehensive metabolic panel, fasting lipid profile, TSH, CBC, and urinalysis. Pap smear not indicated this year. We'll schedule a bone density study next year. Patient to receive the Tdap vaccine today.   Terrance Mass MD, 11:23 AM 04/05/2015

## 2015-04-17 ENCOUNTER — Other Ambulatory Visit: Payer: Managed Care, Other (non HMO)

## 2015-04-17 DIAGNOSIS — Z01419 Encounter for gynecological examination (general) (routine) without abnormal findings: Secondary | ICD-10-CM

## 2015-04-17 LAB — CBC WITH DIFFERENTIAL/PLATELET
BASOS PCT: 0 % (ref 0–1)
Basophils Absolute: 0 10*3/uL (ref 0.0–0.1)
EOS PCT: 3 % (ref 0–5)
Eosinophils Absolute: 0.2 10*3/uL (ref 0.0–0.7)
HCT: 39.3 % (ref 36.0–46.0)
Hemoglobin: 12.9 g/dL (ref 12.0–15.0)
Lymphocytes Relative: 37 % (ref 12–46)
Lymphs Abs: 2.6 10*3/uL (ref 0.7–4.0)
MCH: 27.3 pg (ref 26.0–34.0)
MCHC: 32.8 g/dL (ref 30.0–36.0)
MCV: 83.1 fL (ref 78.0–100.0)
MPV: 8.6 fL (ref 8.6–12.4)
Monocytes Absolute: 0.3 10*3/uL (ref 0.1–1.0)
Monocytes Relative: 5 % (ref 3–12)
Neutro Abs: 3.8 10*3/uL (ref 1.7–7.7)
Neutrophils Relative %: 55 % (ref 43–77)
PLATELETS: 207 10*3/uL (ref 150–400)
RBC: 4.73 MIL/uL (ref 3.87–5.11)
RDW: 13.8 % (ref 11.5–15.5)
WBC: 6.9 10*3/uL (ref 4.0–10.5)

## 2015-04-17 LAB — COMPREHENSIVE METABOLIC PANEL
ALT: 21 U/L (ref 6–29)
AST: 18 U/L (ref 10–30)
Albumin: 3.8 g/dL (ref 3.6–5.1)
Alkaline Phosphatase: 76 U/L (ref 33–115)
BILIRUBIN TOTAL: 0.4 mg/dL (ref 0.2–1.2)
BUN: 12 mg/dL (ref 7–25)
CO2: 27 mmol/L (ref 20–31)
Calcium: 9 mg/dL (ref 8.6–10.2)
Chloride: 104 mmol/L (ref 98–110)
Creat: 0.74 mg/dL (ref 0.50–1.10)
GLUCOSE: 84 mg/dL (ref 65–99)
POTASSIUM: 4.3 mmol/L (ref 3.5–5.3)
Sodium: 140 mmol/L (ref 135–146)
Total Protein: 7 g/dL (ref 6.1–8.1)

## 2015-04-17 LAB — LIPID PANEL
CHOL/HDL RATIO: 4.6 ratio (ref <=5.0)
Cholesterol: 196 mg/dL (ref 125–200)
HDL: 43 mg/dL — ABNORMAL LOW (ref >=46)
LDL Cholesterol: 135 mg/dL — ABNORMAL HIGH (ref <130)
Triglycerides: 89 mg/dL (ref <150)
VLDL: 18 mg/dL (ref <30)

## 2015-04-18 LAB — URINALYSIS W MICROSCOPIC + REFLEX CULTURE
Bacteria, UA: NONE SEEN [HPF]
Bilirubin Urine: NEGATIVE
CASTS: NONE SEEN [LPF]
Crystals: NONE SEEN [HPF]
Glucose, UA: NEGATIVE
Hgb urine dipstick: NEGATIVE
Ketones, ur: NEGATIVE
NITRITE: NEGATIVE
PH: 5.5 (ref 5.0–8.0)
Protein, ur: NEGATIVE
SPECIFIC GRAVITY, URINE: 1.021 (ref 1.001–1.035)
YEAST: NONE SEEN [HPF]

## 2015-04-18 LAB — TSH: TSH: 2.458 u[IU]/mL (ref 0.350–4.500)

## 2015-04-19 ENCOUNTER — Other Ambulatory Visit: Payer: Self-pay | Admitting: Gynecology

## 2015-04-19 MED ORDER — NITROFURANTOIN MONOHYD MACRO 100 MG PO CAPS: 100.0000 mg | ORAL_CAPSULE | Freq: Two times a day (BID) | ORAL | Status: AC

## 2015-04-20 LAB — URINE CULTURE: Colony Count: 30000

## 2016-04-18 ENCOUNTER — Other Ambulatory Visit: Payer: Self-pay | Admitting: Gynecology

## 2016-08-05 ENCOUNTER — Encounter: Payer: Self-pay | Admitting: Gynecology
